# Patient Record
Sex: Female | Born: 1985 | Race: Black or African American | Hispanic: No | Marital: Single | Smoking: Current every day smoker
Health system: Southern US, Community
[De-identification: ages and names within clinical notes are randomized; demographics above are authoritative.]

## PROBLEM LIST (undated history)

## (undated) ENCOUNTER — Inpatient Hospital Stay (HOSPITAL_COMMUNITY): Payer: Self-pay

## (undated) DIAGNOSIS — B009 Herpesviral infection, unspecified: Secondary | ICD-10-CM

## (undated) DIAGNOSIS — A749 Chlamydial infection, unspecified: Secondary | ICD-10-CM

## (undated) DIAGNOSIS — D649 Anemia, unspecified: Secondary | ICD-10-CM

## (undated) DIAGNOSIS — N760 Acute vaginitis: Secondary | ICD-10-CM

## (undated) DIAGNOSIS — A599 Trichomoniasis, unspecified: Secondary | ICD-10-CM

## (undated) DIAGNOSIS — B9689 Other specified bacterial agents as the cause of diseases classified elsewhere: Secondary | ICD-10-CM

## (undated) HISTORY — PX: HERNIA REPAIR: SHX51

## (undated) HISTORY — PX: OTHER SURGICAL HISTORY: SHX169

---
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
## 2007-01-21 ENCOUNTER — Emergency Department (HOSPITAL_COMMUNITY): Admission: EM | Admit: 2007-01-21 | Discharge: 2007-01-21 | Payer: Self-pay | Admitting: Emergency Medicine

## 2007-01-22 ENCOUNTER — Inpatient Hospital Stay (HOSPITAL_COMMUNITY): Admission: AD | Admit: 2007-01-22 | Discharge: 2007-01-23 | Payer: Self-pay | Admitting: Obstetrics & Gynecology

## 2007-06-09 ENCOUNTER — Inpatient Hospital Stay (HOSPITAL_COMMUNITY): Admission: AD | Admit: 2007-06-09 | Discharge: 2007-06-09 | Payer: Self-pay | Admitting: Family Medicine

## 2007-06-10 ENCOUNTER — Inpatient Hospital Stay (HOSPITAL_COMMUNITY): Admission: AD | Admit: 2007-06-10 | Discharge: 2007-06-10 | Payer: Self-pay | Admitting: Family Medicine

## 2009-01-17 ENCOUNTER — Inpatient Hospital Stay (HOSPITAL_COMMUNITY): Admission: AD | Admit: 2009-01-17 | Discharge: 2009-01-17 | Payer: Self-pay | Admitting: Obstetrics & Gynecology

## 2009-01-17 ENCOUNTER — Ambulatory Visit: Payer: Self-pay | Admitting: Advanced Practice Midwife

## 2009-01-22 ENCOUNTER — Inpatient Hospital Stay (HOSPITAL_COMMUNITY): Admission: AD | Admit: 2009-01-22 | Discharge: 2009-01-23 | Payer: Self-pay | Admitting: Obstetrics & Gynecology

## 2009-02-02 ENCOUNTER — Inpatient Hospital Stay (HOSPITAL_COMMUNITY): Admission: RE | Admit: 2009-02-02 | Discharge: 2009-02-02 | Payer: Self-pay | Admitting: Obstetrics & Gynecology

## 2009-02-21 ENCOUNTER — Emergency Department (HOSPITAL_COMMUNITY): Admission: EM | Admit: 2009-02-21 | Discharge: 2009-02-21 | Payer: Self-pay | Admitting: Emergency Medicine

## 2009-03-31 ENCOUNTER — Inpatient Hospital Stay (HOSPITAL_COMMUNITY): Admission: AD | Admit: 2009-03-31 | Discharge: 2009-04-02 | Payer: Self-pay | Admitting: Obstetrics

## 2009-04-17 ENCOUNTER — Emergency Department (HOSPITAL_COMMUNITY): Admission: EM | Admit: 2009-04-17 | Discharge: 2009-04-17 | Payer: Self-pay | Admitting: Emergency Medicine

## 2009-04-28 ENCOUNTER — Ambulatory Visit (HOSPITAL_COMMUNITY): Admission: RE | Admit: 2009-04-28 | Discharge: 2009-04-28 | Payer: Self-pay | Admitting: Obstetrics

## 2009-07-10 ENCOUNTER — Inpatient Hospital Stay (HOSPITAL_COMMUNITY): Admission: AD | Admit: 2009-07-10 | Discharge: 2009-07-10 | Payer: Self-pay | Admitting: Obstetrics

## 2009-07-27 IMAGING — US US OB TRANSVAGINAL
1 series · 14 of 27 positions shown · non-contrast
Comparison: none

OBSTETRICAL ULTRASOUND:
 This ultrasound exam was performed in the [HOSPITAL] Ultrasound Department.  The OB US report was generated in the AS system, and faxed to the ordering physician.  This report is also available in [REDACTED] PACS.

[Series 1: us ob transvaginal · 0.12mm/px · 27 acquisitions, 14 frames shown]
[im 1/27]
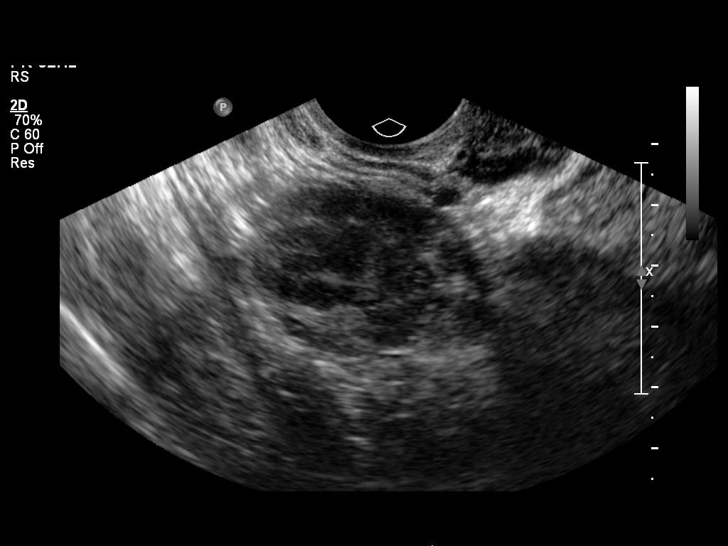
[im 3/27]
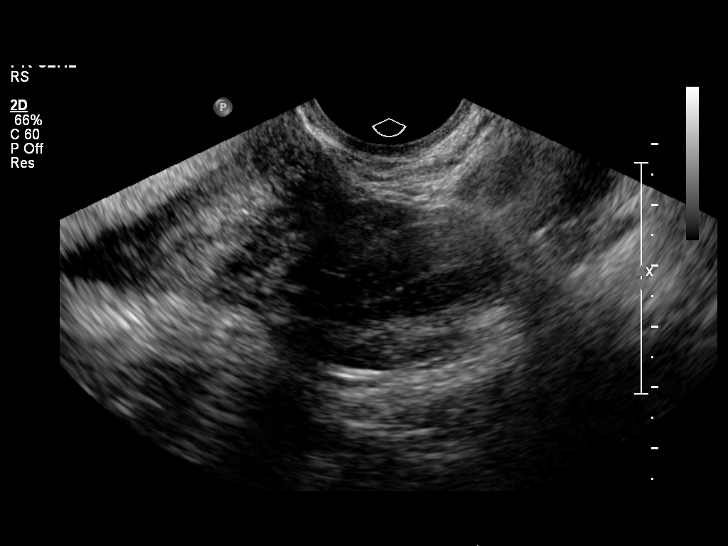
[im 5/27]
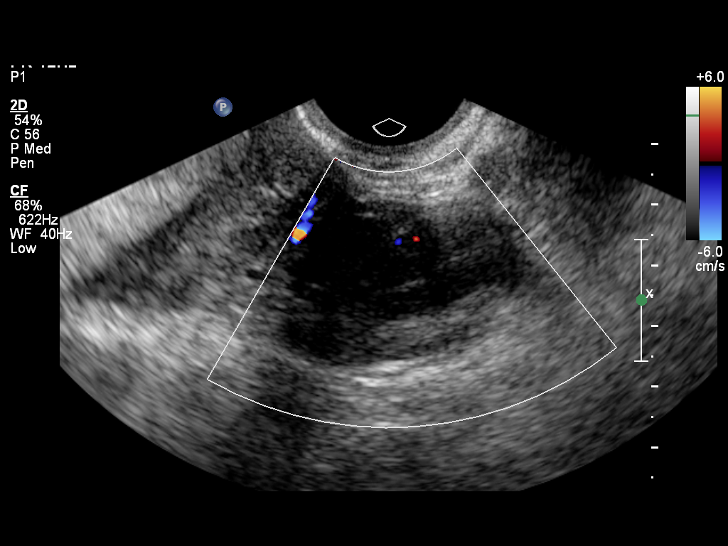
[im 7/27]
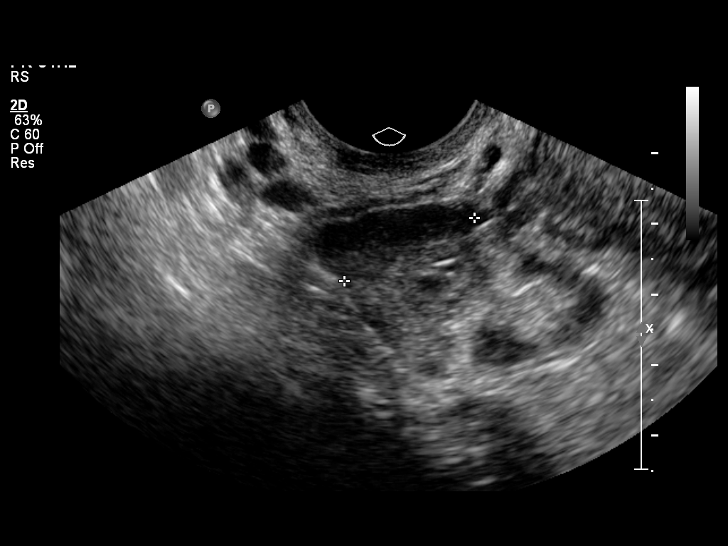
[im 9/27]
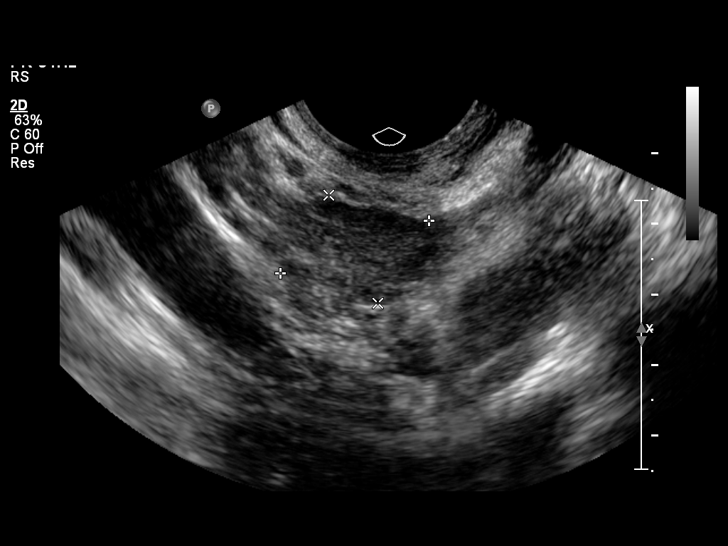
[im 11/27]
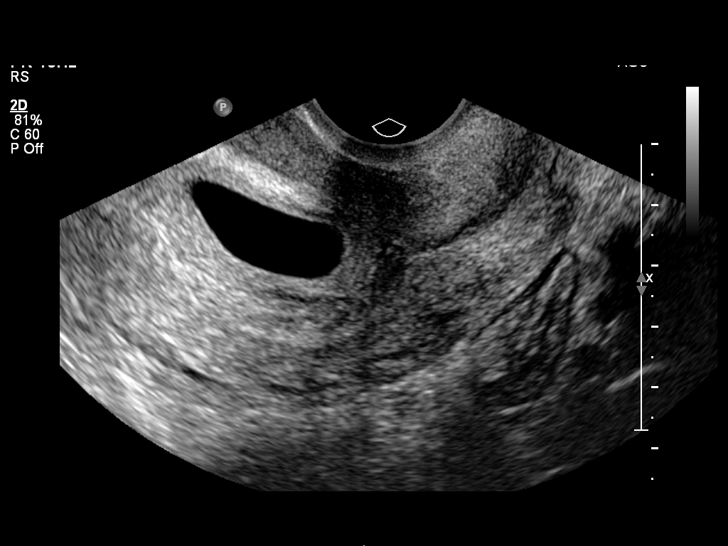
[im 13/27]
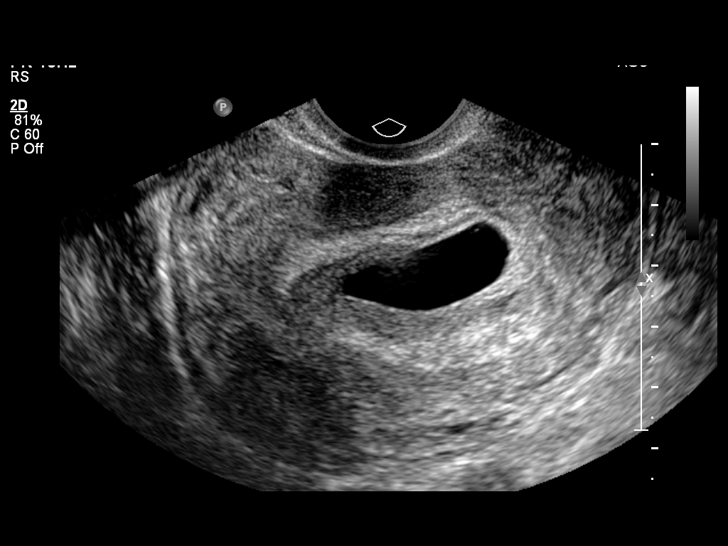
[im 15/27]
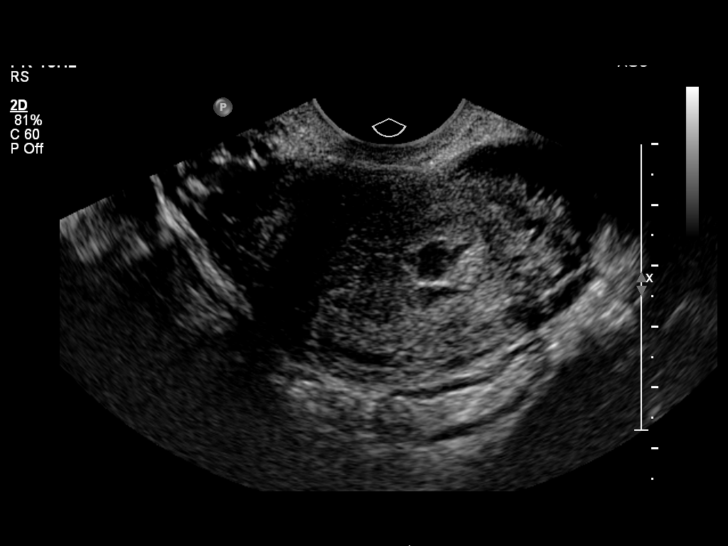
[im 17/27]
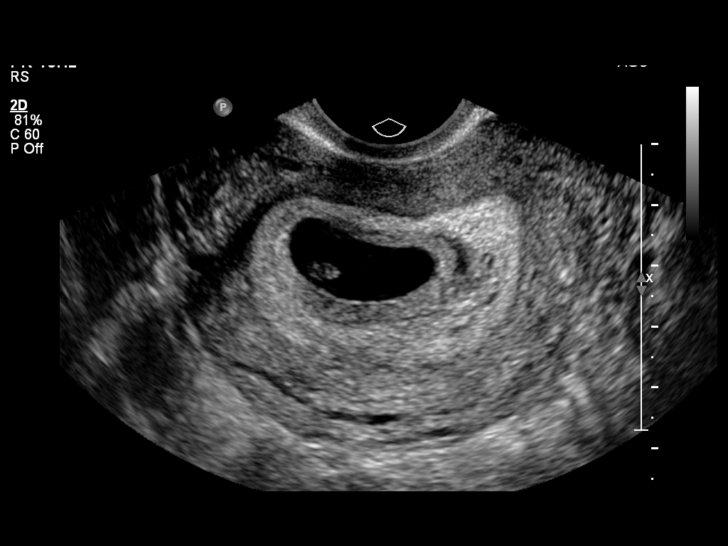
[im 19/27]
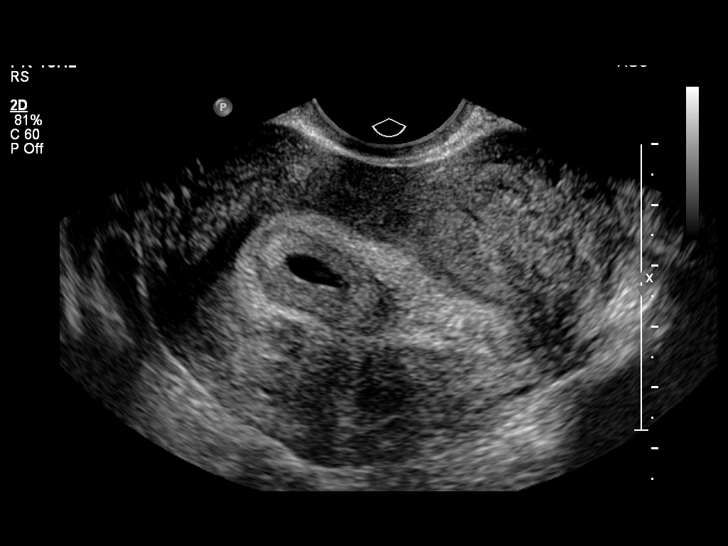
[im 21/27]
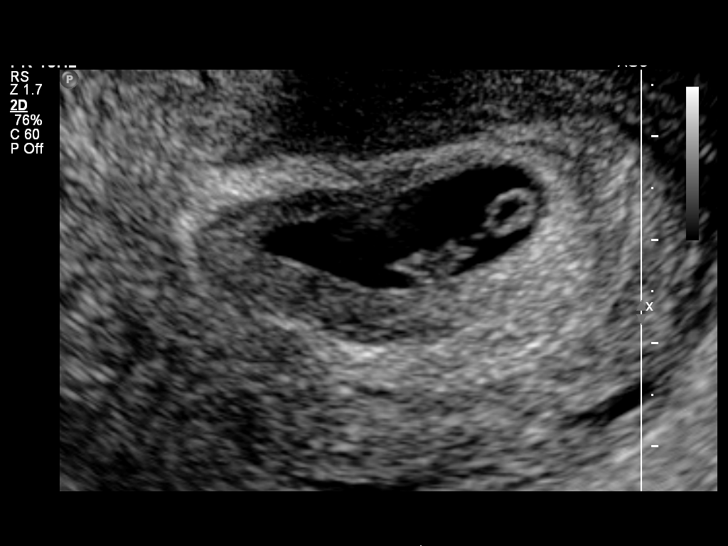
[im 23/27]
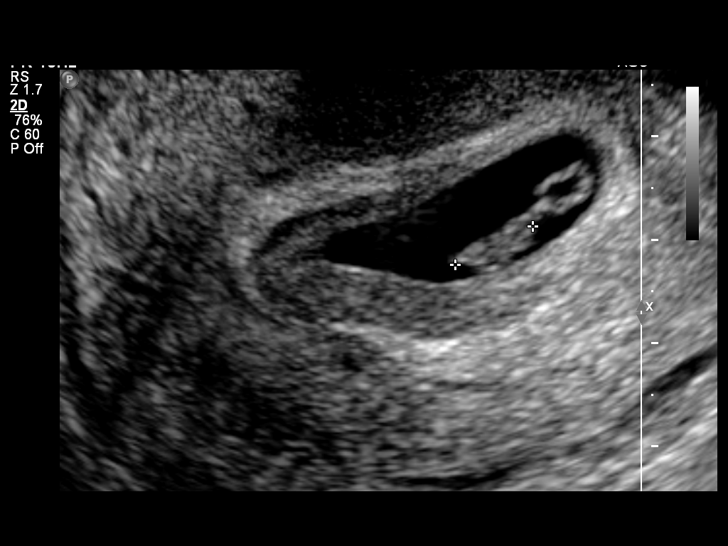
[im 25/27]
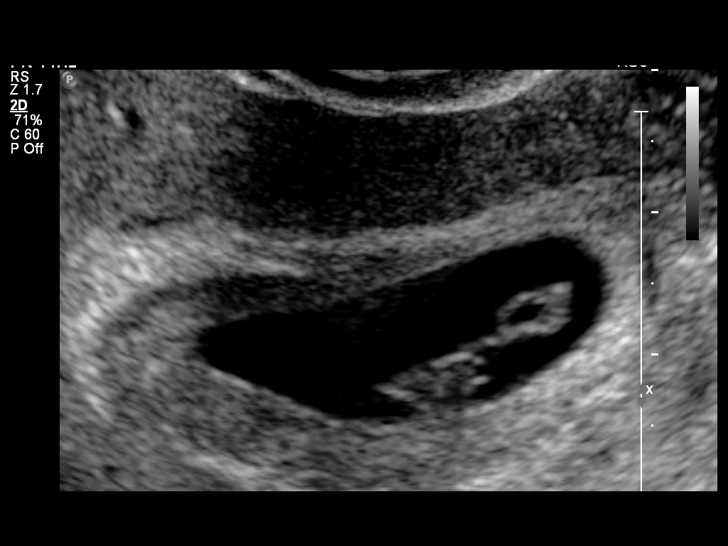
[im 27/27]
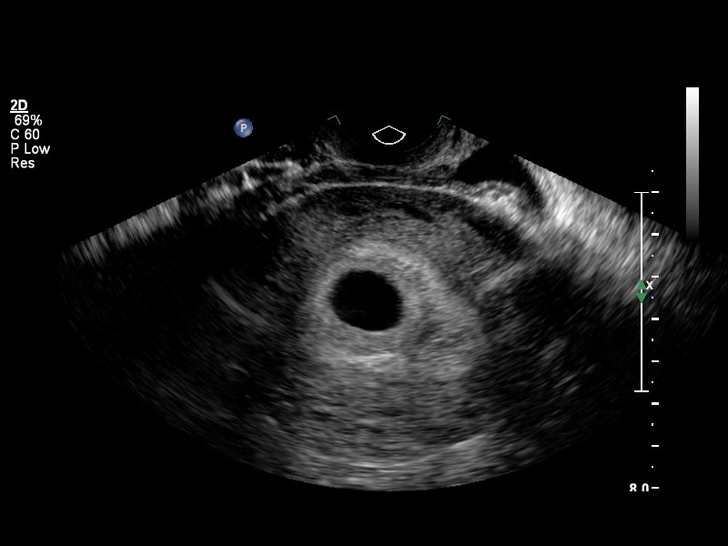

[14 of 27 positions shown; findings below may reference images not displayed]

IMPRESSION: See AS Obstetric US report.

## 2009-08-12 ENCOUNTER — Ambulatory Visit (HOSPITAL_COMMUNITY): Admission: RE | Admit: 2009-08-12 | Discharge: 2009-08-12 | Payer: Self-pay | Admitting: Obstetrics

## 2009-08-24 ENCOUNTER — Inpatient Hospital Stay (HOSPITAL_COMMUNITY): Admission: AD | Admit: 2009-08-24 | Discharge: 2009-08-24 | Payer: Self-pay | Admitting: Obstetrics

## 2009-09-21 ENCOUNTER — Inpatient Hospital Stay (HOSPITAL_COMMUNITY): Admission: AD | Admit: 2009-09-21 | Discharge: 2009-09-25 | Payer: Self-pay | Admitting: Obstetrics

## 2009-09-22 ENCOUNTER — Encounter (INDEPENDENT_AMBULATORY_CARE_PROVIDER_SITE_OTHER): Payer: Self-pay | Admitting: Obstetrics

## 2010-11-27 LAB — CBC
HCT: 28.2 % — ABNORMAL LOW (ref 36.0–46.0)
HCT: 36.2 % (ref 36.0–46.0)
Hemoglobin: 9.6 g/dL — ABNORMAL LOW (ref 12.0–15.0)
MCHC: 33.6 g/dL (ref 30.0–36.0)
MCHC: 33.9 g/dL (ref 30.0–36.0)
MCV: 97.6 fL (ref 78.0–100.0)
Platelets: 156 10*3/uL (ref 150–400)
Platelets: 201 10*3/uL (ref 150–400)
RDW: 13.2 % (ref 11.5–15.5)
WBC: 12.1 10*3/uL — ABNORMAL HIGH (ref 4.0–10.5)

## 2010-12-15 LAB — URINALYSIS, ROUTINE W REFLEX MICROSCOPIC
Bilirubin Urine: NEGATIVE
Hgb urine dipstick: NEGATIVE
Nitrite: NEGATIVE
Protein, ur: NEGATIVE mg/dL
Urobilinogen, UA: 0.2 mg/dL (ref 0.0–1.0)

## 2010-12-15 LAB — WET PREP, GENITAL: Trich, Wet Prep: NONE SEEN

## 2010-12-15 LAB — GC/CHLAMYDIA PROBE AMP, GENITAL

## 2010-12-17 LAB — POCT I-STAT, CHEM 8
Calcium, Ion: 1.15 mmol/L (ref 1.12–1.32)
Glucose, Bld: 78 mg/dL (ref 70–99)
HCT: 36 % (ref 36.0–46.0)
Hemoglobin: 12.2 g/dL (ref 12.0–15.0)
TCO2: 21 mmol/L (ref 0–100)

## 2010-12-18 LAB — URINALYSIS, ROUTINE W REFLEX MICROSCOPIC
Bilirubin Urine: NEGATIVE
Ketones, ur: 15 mg/dL — AB
Nitrite: NEGATIVE
Protein, ur: NEGATIVE mg/dL
Urobilinogen, UA: 1 mg/dL (ref 0.0–1.0)
pH: 5.5 (ref 5.0–8.0)

## 2010-12-18 LAB — COMPREHENSIVE METABOLIC PANEL
Albumin: 3.7 g/dL (ref 3.5–5.2)
BUN: 3 mg/dL — ABNORMAL LOW (ref 6–23)
Chloride: 97 mEq/L (ref 96–112)
Creatinine, Ser: 0.45 mg/dL (ref 0.4–1.2)
Total Bilirubin: 0.4 mg/dL (ref 0.3–1.2)

## 2010-12-18 LAB — GLUCOSE, CAPILLARY: Glucose-Capillary: 124 mg/dL — ABNORMAL HIGH (ref 70–99)

## 2010-12-20 LAB — HCG, QUANTITATIVE, PREGNANCY

## 2010-12-20 LAB — WET PREP, GENITAL
Clue Cells Wet Prep HPF POC: NONE SEEN
Trich, Wet Prep: NONE SEEN
Trich, Wet Prep: NONE SEEN

## 2010-12-20 LAB — URINALYSIS, ROUTINE W REFLEX MICROSCOPIC
Bilirubin Urine: NEGATIVE
Bilirubin Urine: NEGATIVE
Ketones, ur: NEGATIVE mg/dL
Ketones, ur: NEGATIVE mg/dL
Nitrite: NEGATIVE
Nitrite: NEGATIVE
Specific Gravity, Urine: 1.025 (ref 1.005–1.030)
Urobilinogen, UA: 0.2 mg/dL (ref 0.0–1.0)
Urobilinogen, UA: 2 mg/dL — ABNORMAL HIGH (ref 0.0–1.0)

## 2010-12-20 LAB — CBC
MCHC: 34.3 g/dL (ref 30.0–36.0)
MCV: 91.7 fL (ref 78.0–100.0)
Platelets: 331 10*3/uL (ref 150–400)
RDW: 14.3 % (ref 11.5–15.5)

## 2010-12-20 LAB — GC/CHLAMYDIA PROBE AMP, GENITAL

## 2011-01-24 NOTE — Discharge Summary (Signed)
NAMEZULEY, LUTTER NO.:  [id number]   MEDICAL RECORD NO.:  [id number]          PATIENT TYPE:  INP   LOCATION:  9303                          FACILITY:  [facility]   PHYSICIAN:  Kathreen Cosier, M.D.DATE OF BIRTH:  [birth year]   DATE OF ADMISSION:  03/31/2009  DATE OF DISCHARGE:                               DISCHARGE SUMMARY   The patient is a 25 year old gravida 1, EDC October 01, 2009.  A 2-week  history of nausea, vomiting, and on admission her potassium was 2.9.  She had low ketone in the urine, and she received Ringer's lactate IV  plus 40 mEq of KCl to each IV.  She was started on the steroid taper and  during the course of April 02, 2009, she had no more nausea, vomiting,  and was discharged on July 23, ambulatory to see me in 1-week for her  next prenatal visit.   DISCHARGE DIAGNOSIS:  Status post hyperemesis of pregnancy.           ______________________________  Kathreen Cosier, M.D.     BAM/MEDQ  D:  04/02/2009  T:  04/02/2009  Job:  [id number]

## 2011-03-04 ENCOUNTER — Inpatient Hospital Stay (HOSPITAL_COMMUNITY): Payer: Self-pay

## 2011-03-04 ENCOUNTER — Inpatient Hospital Stay (HOSPITAL_COMMUNITY)
Admission: AD | Admit: 2011-03-04 | Discharge: 2011-03-04 | Disposition: A | Discharge status: Home / Self Care | Payer: Self-pay | Source: Ambulatory Visit | Attending: Obstetrics & Gynecology | Admitting: Obstetrics & Gynecology

## 2011-03-04 DIAGNOSIS — A5901 Trichomonal vulvovaginitis: Secondary | ICD-10-CM | POA: Insufficient documentation

## 2011-03-04 DIAGNOSIS — N644 Mastodynia: Secondary | ICD-10-CM

## 2011-03-04 LAB — WET PREP, GENITAL
Clue Cells Wet Prep HPF POC: NONE SEEN
Yeast Wet Prep HPF POC: NEGATIVE — AB

## 2011-03-04 LAB — URINALYSIS, ROUTINE W REFLEX MICROSCOPIC
Nitrite: NEGATIVE
Protein, ur: NEGATIVE mg/dL
Specific Gravity, Urine: >1.03 — ABNORMAL HIGH (ref 1.005–1.030)
Urobilinogen, UA: 0.2 mg/dL (ref 0.0–1.0)

## 2011-03-04 LAB — URINE MICROSCOPIC-ADD ON

## 2011-03-07 LAB — GC/CHLAMYDIA PROBE AMP, GENITAL
Chlamydia, DNA Probe: NEGATIVE
GC Probe Amp, Genital: NEGATIVE

## 2011-06-22 LAB — POCT PREGNANCY, URINE
Operator id: 13440
Preg Test, Ur: NEGATIVE

## 2011-08-21 ENCOUNTER — Encounter (HOSPITAL_COMMUNITY): Payer: Self-pay | Admitting: *Deleted

## 2011-08-21 ENCOUNTER — Inpatient Hospital Stay (HOSPITAL_COMMUNITY)
Admission: AD | Admit: 2011-08-21 | Discharge: 2011-08-21 | Disposition: A | Discharge status: Home / Self Care | Payer: Medicaid Other | Source: Ambulatory Visit | Attending: Obstetrics & Gynecology | Admitting: Obstetrics & Gynecology

## 2011-08-21 DIAGNOSIS — N72 Inflammatory disease of cervix uteri: Secondary | ICD-10-CM | POA: Insufficient documentation

## 2011-08-21 DIAGNOSIS — R109 Unspecified abdominal pain: Secondary | ICD-10-CM | POA: Insufficient documentation

## 2011-08-21 DIAGNOSIS — A5901 Trichomonal vulvovaginitis: Secondary | ICD-10-CM | POA: Insufficient documentation

## 2011-08-21 DIAGNOSIS — A599 Trichomoniasis, unspecified: Secondary | ICD-10-CM

## 2011-08-21 HISTORY — DX: Chlamydial infection, unspecified: A74.9

## 2011-08-21 HISTORY — DX: Trichomoniasis, unspecified: A59.9

## 2011-08-21 HISTORY — DX: Herpesviral infection, unspecified: B00.9

## 2011-08-21 LAB — WET PREP, GENITAL: Yeast Wet Prep HPF POC: NONE SEEN

## 2011-08-21 LAB — URINE MICROSCOPIC-ADD ON

## 2011-08-21 LAB — URINALYSIS, ROUTINE W REFLEX MICROSCOPIC
Bilirubin Urine: NEGATIVE
Ketones, ur: NEGATIVE mg/dL
Nitrite: NEGATIVE
pH: 6 (ref 5.0–8.0)

## 2011-08-21 MED ORDER — AZITHROMYCIN 250 MG PO TABS
1000.0000 mg | ORAL_TABLET | Freq: Once | ORAL | Status: AC
  Administered 2011-08-21: 1000 mg via ORAL
  Filled 2011-08-21: qty 4

## 2011-08-21 MED ORDER — METRONIDAZOLE 500 MG PO TABS: 500.0000 mg | ORAL_TABLET | Freq: Two times a day (BID) | ORAL | Status: AC

## 2011-08-21 NOTE — Progress Notes (Signed)
Patient states she has cramping for about one week. Has had some brown spotting with an odor in between periods.

## 2011-08-21 NOTE — ED Provider Notes (Signed)
History     CSN: [id number] Arrival date & time: 08/21/2011  4:20 PM   None     Chief Complaint  Patient presents with  . Abdominal Pain    HPI Katelyn Shaw is a 25 y.o. female who presents to MAU for vaginal discharge that started about a week ago. Current sex partner for a few years. History of trichomonas, chlamydia and HSV. Describes the discharge as watery with vaginal itching. Also c/o lower abdominal cramping the past few days.  Past Medical History  Diagnosis Date  . Trichomonas   . Chlamydia   . Herpes     Type 1, lesion X 1    Past Surgical History  Procedure Date  . Umbilical hernia   . Cyst removed from r ear     Family History  Problem Relation Age of Onset  . Anesthesia problems Neg Hx     History  Substance Use Topics  . Smoking status: Current Everyday Smoker -- 1.0 packs/day  . Smokeless tobacco: Never Used  . Alcohol Use: No     Occas. once per month    OB History    Grav Para Term Preterm Abortions TAB SAB Ect Mult Living   1 1 1  0 0 0 0 0 0 1      Review of Systems  Constitutional: Negative for fever, chills, diaphoresis and fatigue.  HENT: Positive for congestion. Negative for ear pain, sore throat, facial swelling, neck pain, neck stiffness, dental problem and sinus pressure.   Eyes: Negative for photophobia, pain and discharge.  Respiratory: Positive for cough. Negative for chest tightness and wheezing.        Just getting over URI  Gastrointestinal: Negative for nausea, vomiting, abdominal pain, diarrhea, constipation and abdominal distention.  Genitourinary: Positive for vaginal bleeding, vaginal discharge, vaginal pain and pelvic pain. Negative for dysuria, frequency, flank pain and difficulty urinating.  Musculoskeletal: Negative for myalgias, back pain and gait problem.  Skin: Negative for color change and rash.  Neurological: Positive for headaches. Negative for dizziness, speech difficulty, weakness, light-headedness and  numbness.  Psychiatric/Behavioral: Negative for confusion and agitation.    Allergies  Codeine  Home Medications  No current outpatient prescriptions on file.  BP 129/77  Pulse 69  Temp(Src) 98.4 F (36.9 C) (Oral)  Resp 16  Ht 5' 6.5" (1.689 m)  Wt 164 lb 12.8 oz (74.753 kg)  BMI 26.20 kg/m2  SpO2 98%  LMP 08/13/2011  Physical Exam  Nursing note and vitals reviewed. Constitutional: She is oriented to person, place, and time. She appears well-developed and well-nourished. No distress.  HENT:  Head: Normocephalic.  Eyes: EOM are normal.  Neck: Neck supple.  Cardiovascular: Normal rate.   Pulmonary/Chest: Effort normal.  Abdominal: Soft. There is no tenderness.  Genitourinary:       External genitalia without lesions. Frothy yellow discharge vaginal vault. Cervix inflamed with strawberry appearance. Mild CMT, no adnexal mass or tenderness palpated. Uterus without palpable enlargement.  Musculoskeletal: Normal range of motion.  Neurological: She is alert and oriented to person, place, and time. No cranial nerve deficit.  Skin: Skin is warm and dry.  Psychiatric: She has a normal mood and affect. Her behavior is normal. Judgment and thought content normal.   Results for orders placed during the hospital encounter of 08/21/11 (from the past 24 hour(s))  URINALYSIS, ROUTINE W REFLEX MICROSCOPIC     Status: Abnormal   Collection Time   08/21/11  4:35 PM  Component Value Range   Color, Urine YELLOW  YELLOW    APPearance HAZY (*) CLEAR    Specific Gravity, Urine >1.030 (*) 1.005 - 1.030    pH 6.0  5.0 - 8.0    Glucose, UA NEGATIVE  NEGATIVE (mg/dL)   Hgb urine dipstick SMALL (*) NEGATIVE    Bilirubin Urine NEGATIVE  NEGATIVE    Ketones, ur NEGATIVE  NEGATIVE (mg/dL)   Protein, ur NEGATIVE  NEGATIVE (mg/dL)   Urobilinogen, UA 0.2  0.0 - 1.0 (mg/dL)   Nitrite NEGATIVE  NEGATIVE    Leukocytes, UA NEGATIVE  NEGATIVE   URINE MICROSCOPIC-ADD ON     Status: Abnormal    Collection Time   08/21/11  4:35 PM      Component Value Range   Squamous Epithelial / LPF FEW (*) RARE    WBC, UA 3-6  <3 (WBC/hpf)   RBC / HPF 0-2  <3 (RBC/hpf)   Bacteria, UA FEW (*) RARE    Urine-Other MUCOUS PRESENT    POCT PREGNANCY, URINE     Status: Normal   Collection Time   08/21/11  5:11 PM      Component Value Range   Preg Test, Ur NEGATIVE    WET PREP, GENITAL     Status: Abnormal   Collection Time   08/21/11  5:35 PM      Component Value Range   Yeast, Wet Prep NONE SEEN  NONE SEEN    Trich, Wet Prep MODERATE (*) NONE SEEN    Clue Cells, Wet Prep FEW (*) NONE SEEN    WBC, Wet Prep HPF POC FEW (*) NONE SEEN    Assessment: Trichomonas vaginosis   Cervicitis  Plan:  Flagyl Rx   Zithromax 1 gram po ED Course  Procedures        [place], NP 08/21/11 1806

## 2011-08-22 LAB — GC/CHLAMYDIA PROBE AMP, GENITAL
Chlamydia, DNA Probe: NEGATIVE
GC Probe Amp, Genital: NEGATIVE

## 2011-08-23 NOTE — ED Provider Notes (Signed)
Agree with above note.  LEGGETT,KELLY H. 08/23/2011 12:44 PM  

## 2011-08-26 IMAGING — US US PELVIS COMPLETE
1 series · 14 of 25 positions shown · non-contrast
Comparison: Prior OB ultrasounds 2858

CLINICAL DATA: Pelvic pain, enlarged uterus on physical exam

TRANSABDOMINAL AND TRANSVAGINAL ULTRASOUND OF PELVIS
TECHNIQUE: Both transabdominal and transvaginal ultrasound
examinations of the pelvis were performed. Transabdominal technique
was performed for global imaging of the pelvis including uterus,
ovaries, adnexal regions, and pelvic cul-de-sac.

[Series 1: us pelvis complete · 14 of 38 slices shown]
[im 1/38]
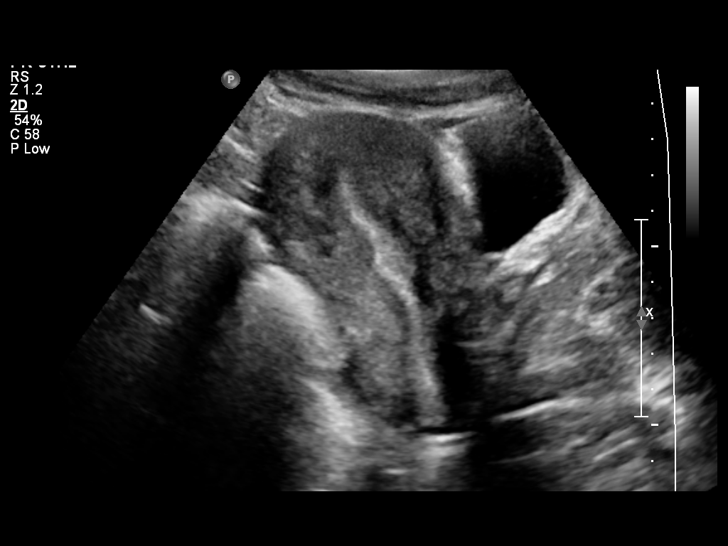
[im 4/38]
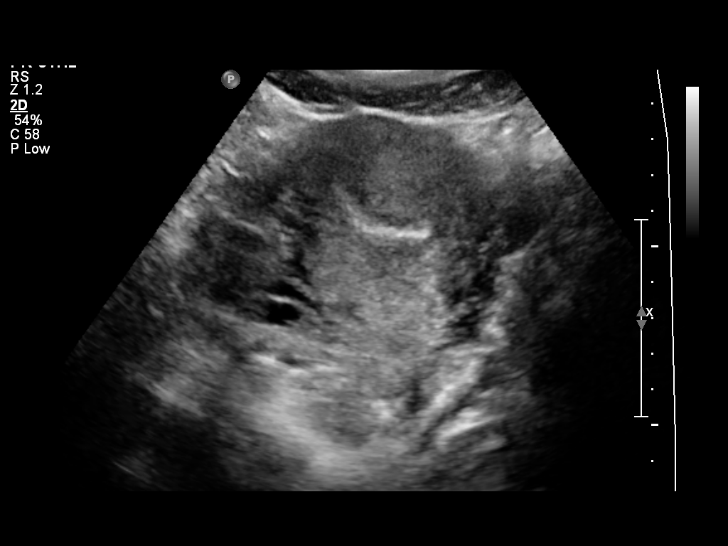
[im 7/38]
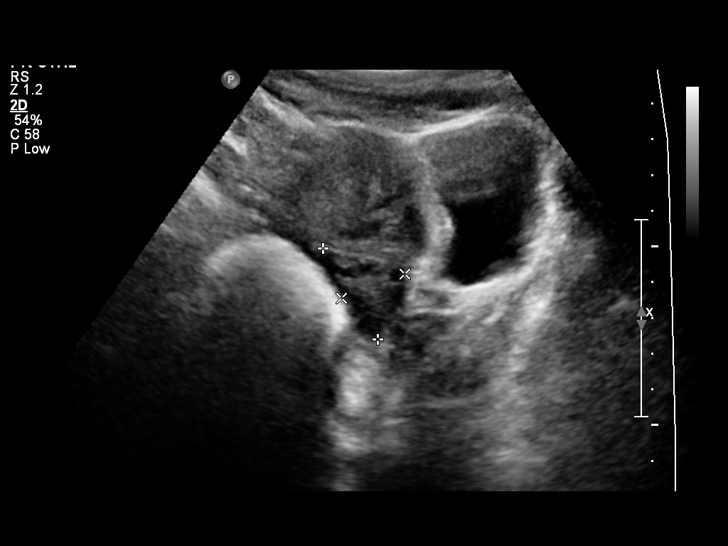
[im 10/38]
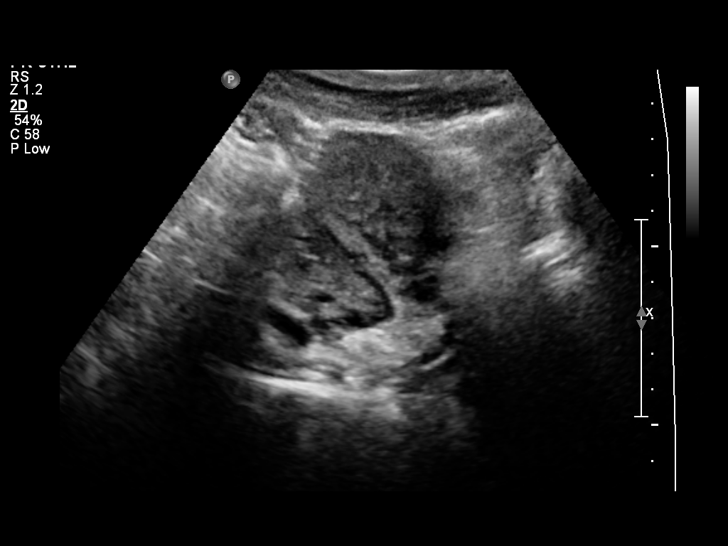
[im 13/38]
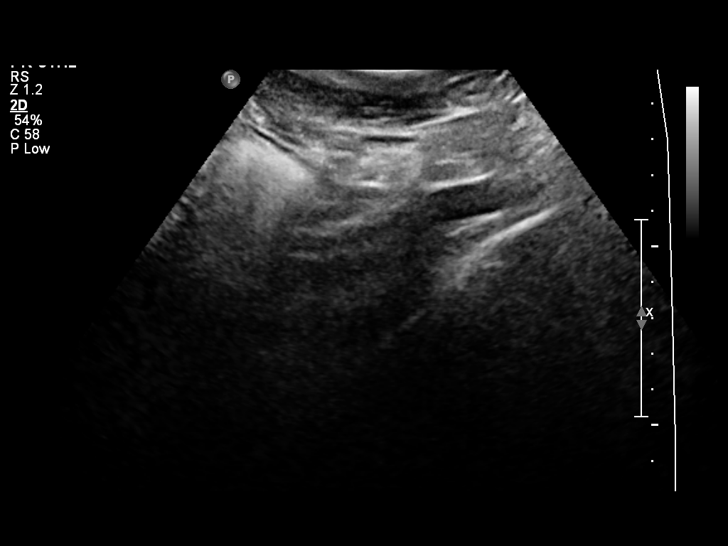
[im 14/38]
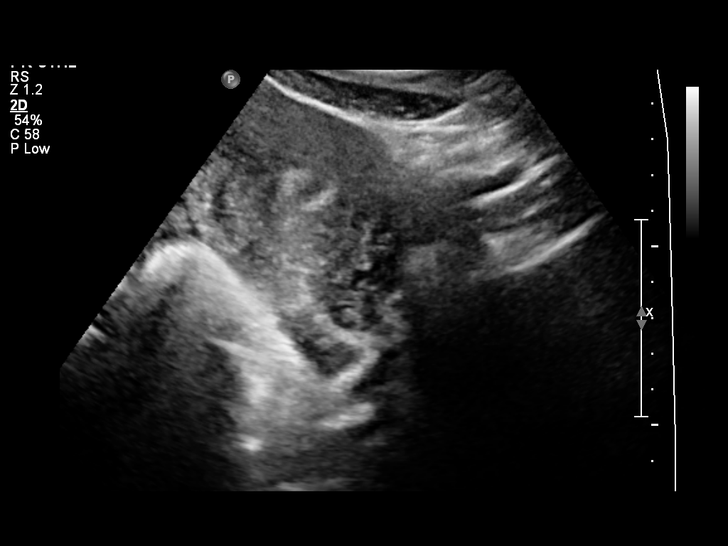
[im 17/38]
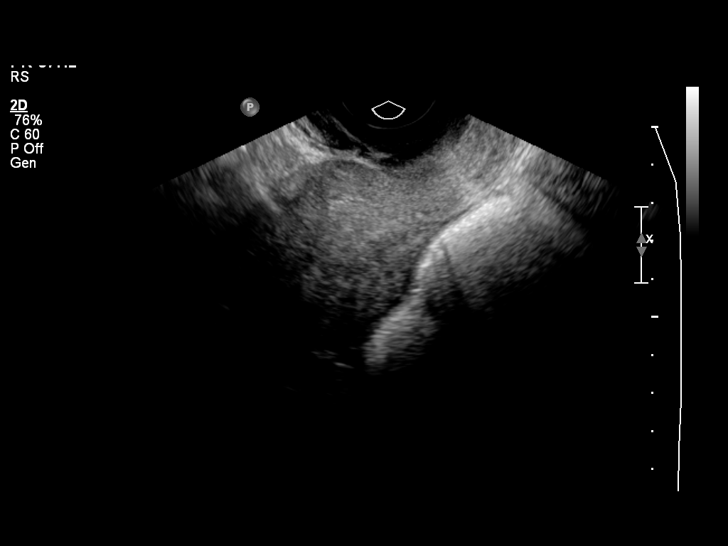
[im 21/38]
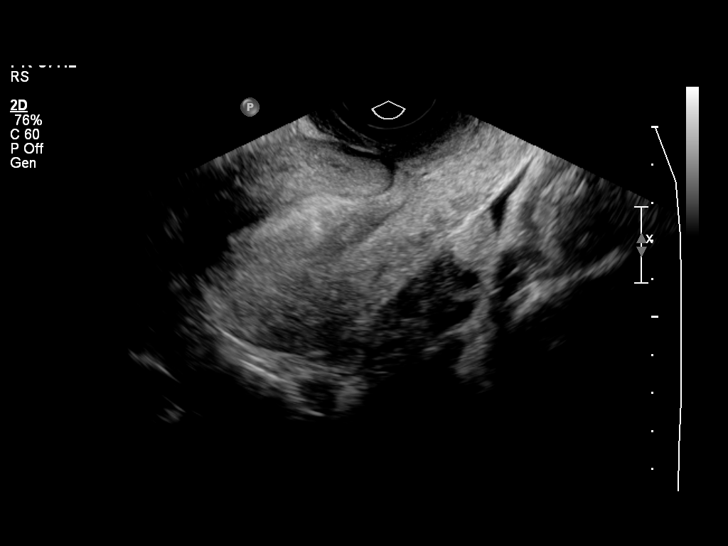
[im 24/38]
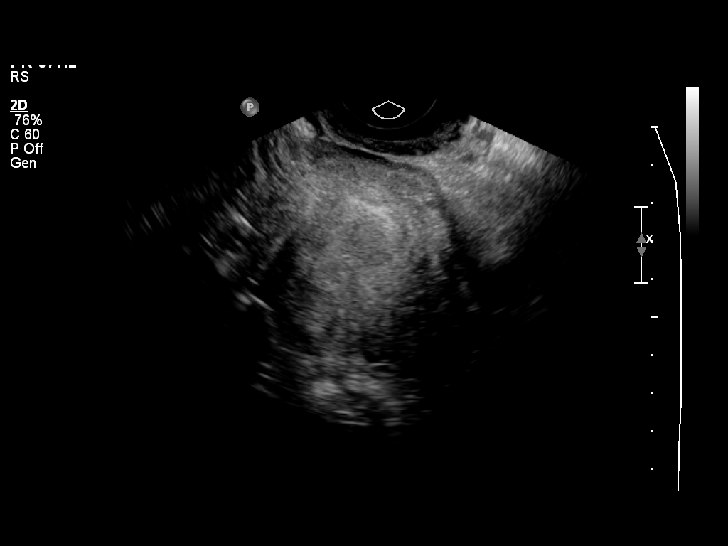
[im 25/38]
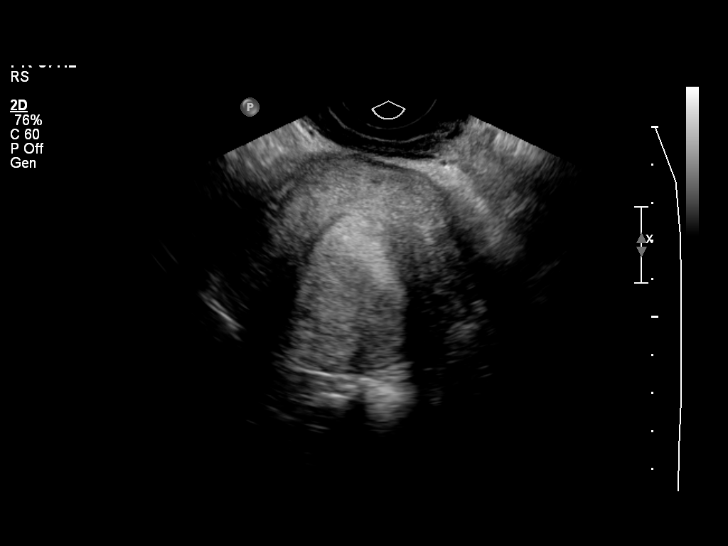
[im 28/38]
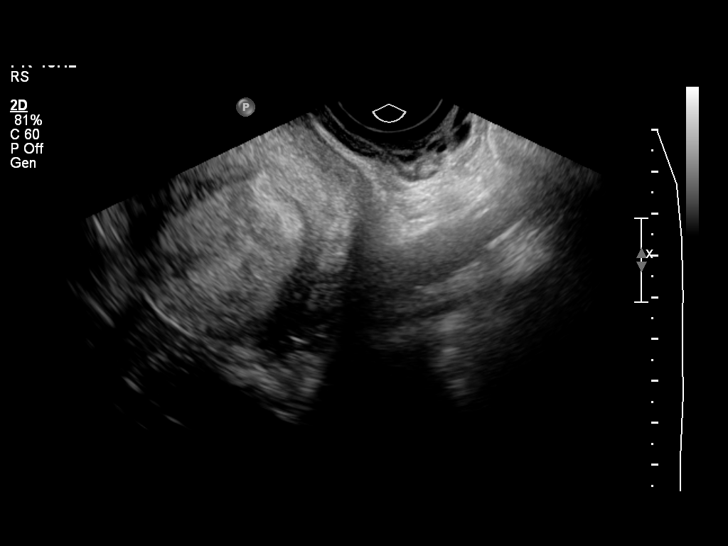
[im 31/38]
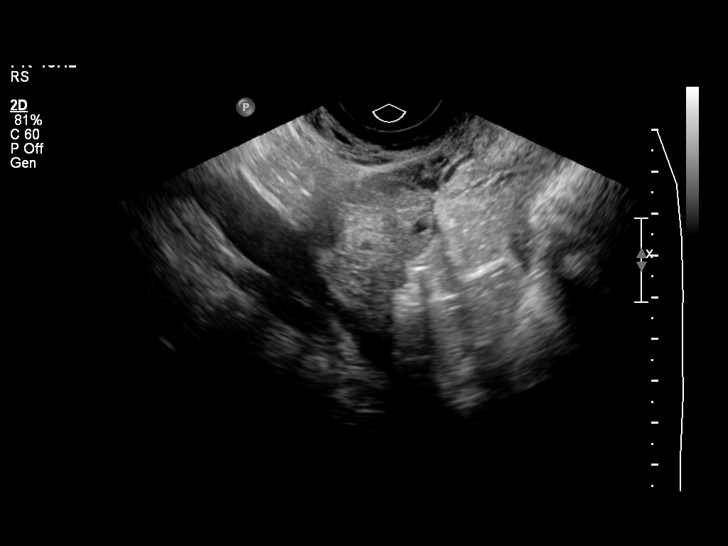
[im 34/38]
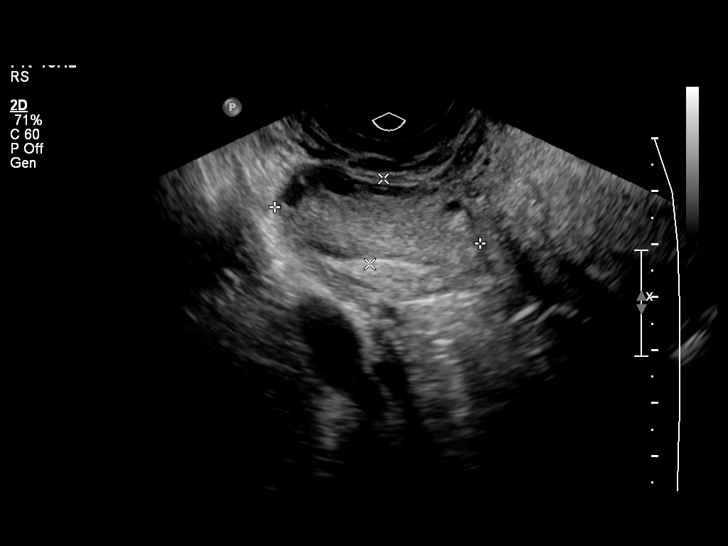
[im 38/38]
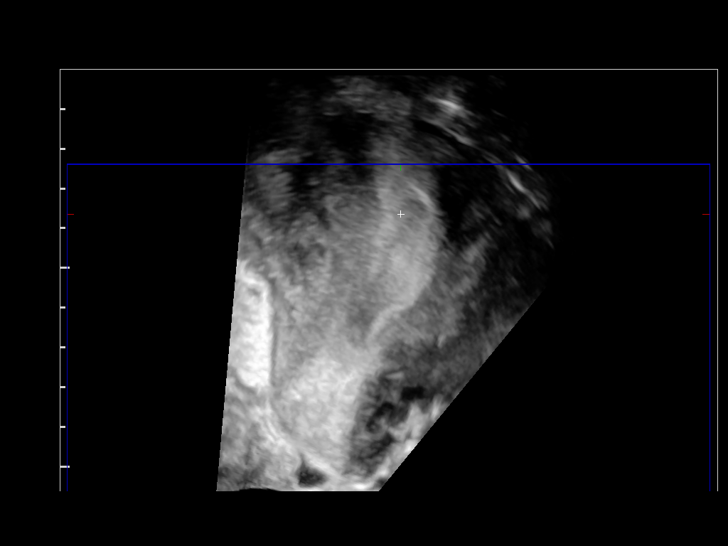

[14 of 25 positions shown; findings below may reference images not displayed]

It was necessary to proceed with endovaginal exam following the
transabdomnial exam to visualize the endometrium.
FINDINGS: Uterus: 10.2 x 7.0 x 5.4 cm.  Anteverted, anteflexed.  No focal
abnormality.

Endometrium: 1.2 cm.  Uniformly echogenic.

Right ovary:  3.9 x 2.9 x 1.6 cm.  Normal.

Left ovary: 3.6 x 2.6 x 1.8 cm.  Normal.

Other findings: No free fluid
IMPRESSION: Normal exam.

## 2012-08-07 ENCOUNTER — Encounter (HOSPITAL_COMMUNITY): Payer: Self-pay | Admitting: *Deleted

## 2012-08-07 ENCOUNTER — Emergency Department (HOSPITAL_COMMUNITY)
Admission: EM | Admit: 2012-08-07 | Discharge: 2012-08-07 | Disposition: A | Discharge status: Home / Self Care | Payer: Medicaid Other | Attending: Emergency Medicine | Admitting: Emergency Medicine

## 2012-08-07 DIAGNOSIS — F17200 Nicotine dependence, unspecified, uncomplicated: Secondary | ICD-10-CM | POA: Insufficient documentation

## 2012-08-07 DIAGNOSIS — Z8619 Personal history of other infectious and parasitic diseases: Secondary | ICD-10-CM | POA: Insufficient documentation

## 2012-08-07 DIAGNOSIS — J020 Streptococcal pharyngitis: Secondary | ICD-10-CM | POA: Insufficient documentation

## 2012-08-07 LAB — RAPID STREP SCREEN (MED CTR MEBANE ONLY): Streptococcus, Group A Screen (Direct): POSITIVE — AB

## 2012-08-07 MED ORDER — HYDROCODONE-ACETAMINOPHEN 7.5-500 MG/15ML PO SOLN: 10.0000 mL | ORAL | Status: DC | PRN

## 2012-08-07 MED ORDER — PENICILLIN G BENZATHINE 1200000 UNIT/2ML IM SUSP
1.2000 10*6.[IU] | Freq: Once | INTRAMUSCULAR | Status: AC
  Administered 2012-08-07: 1.2 10*6.[IU] via INTRAMUSCULAR
  Filled 2012-08-07: qty 2

## 2012-08-07 MED ORDER — PREDNISONE 20 MG PO TABS
60.0000 mg | ORAL_TABLET | Freq: Once | ORAL | Status: AC
  Administered 2012-08-07: 60 mg via ORAL
  Filled 2012-08-07: qty 3

## 2012-08-07 MED ORDER — HYDROCODONE-ACETAMINOPHEN 7.5-500 MG/15ML PO SOLN
10.0000 mL | Freq: Once | ORAL | Status: AC
  Administered 2012-08-07: 10 mL via ORAL
  Filled 2012-08-07: qty 15

## 2012-08-07 NOTE — ED Notes (Signed)
Patient c/o sorethroat onset several days ago states pain is worse in the am. Throat is red and tonsils swollen. C/o occ. Nasal congestion.

## 2012-08-07 NOTE — ED Provider Notes (Signed)
History     CSN: [id number]  Arrival date & time 08/07/12  7829   First MD Initiated Contact with Patient 08/07/12 [phone]      No chief complaint on file.   (Consider location/radiation/quality/duration/timing/severity/associated sxs/prior treatment) HPI Katelyn Shaw is a 26 y.o. female who presents with complaint of a sore throat. Pt states started few days ago. Throat/tonsils swollen, painful to swallow. States took robitussin with no relief. Stats family member has same problem, was seen yesterday, started on antibiotic, states he is feeling much better today. Pt admits feeling feverish, did not measure temp. Denies change in voice. Denies other URI symptoms.    Past Medical History  Diagnosis Date  . Trichomonas   . Chlamydia   . Herpes     Type 1, lesion X 1    Past Surgical History  Procedure Date  . Umbilical hernia   . Cyst removed from r ear   . Hernia repair     Family History  Problem Relation Age of Onset  . Anesthesia problems Neg Hx     History  Substance Use Topics  . Smoking status: Current Every Day Smoker -- 1.0 packs/day  . Smokeless tobacco: Never Used  . Alcohol Use: Yes     Comment: Occas. once per month    OB History    Grav Para Term Preterm Abortions TAB SAB Ect Mult Living   1 1 1  0 0 0 0 0 0 1      Review of Systems  Constitutional: Negative for fever and chills.  HENT: Positive for sore throat. Negative for congestion, neck pain, neck stiffness and sinus pressure.   Respiratory: Negative.   Cardiovascular: Negative.   Skin: Negative for rash.  Neurological: Negative for dizziness and weakness.    Allergies  Codeine  Home Medications  No current outpatient prescriptions on file.  BP 114/67  Pulse 102  Temp 99.1 F (37.3 C) (Oral)  Resp 18  SpO2 98%  Physical Exam  Nursing note and vitals reviewed. Constitutional: She is oriented to person, place, and time. She appears well-developed and well-nourished. No distress.   HENT:  Head: Normocephalic.  Right Ear: Tympanic membrane, external ear and ear canal normal.  Left Ear: External ear and ear canal normal.  Nose: Nose normal.  Mouth/Throat: Mucous membranes are normal. No uvula swelling. Oropharyngeal exudate present. No tonsillar abscesses.       Tonsils enlarged, white exudate. Uvula midline  Eyes: Conjunctivae normal are normal.  Neck: Neck supple.       Anterior cervical and posterior lymphadenopathy, tender  Cardiovascular: Normal rate, regular rhythm and normal heart sounds.   Pulmonary/Chest: Effort normal and breath sounds normal. No respiratory distress. She has no wheezes. She has no rales.  Lymphadenopathy:    She has cervical adenopathy.  Neurological: She is alert and oriented to person, place, and time.  Skin: Skin is warm and dry.  Psychiatric: She has a normal mood and affect.    ED Course  Procedures (including critical care time)  Results for orders placed during the hospital encounter of 08/07/12  RAPID STREP SCREEN      Component Value Range   Streptococcus, Group A Screen (Direct) POSITIVE (*) NEGATIVE   No results found.    1. Strep pharyngitis       MDM  Pt with sore throat, exam and screen consistent with strep pharyngitis. Pt non toxic. No evidence of peritonsillar or retropharyngeal abscess. Pt is able to swallow  and tolerating PO fluids. Pt choise to have a bicillin shot, 1.28million units given IM. Pain medications prescribed for discomfort. D/c home with precautions and follow up.        Lottie Mussel, [place] 08/07/12 [phone]

## 2012-08-08 NOTE — ED Provider Notes (Signed)
Medical screening examination/treatment/procedure(s) were performed by non-physician practitioner and as supervising physician I was immediately available for consultation/collaboration.   Hurman Horn, MD 08/08/12 [phone]

## 2012-11-18 ENCOUNTER — Inpatient Hospital Stay (HOSPITAL_COMMUNITY): Payer: Medicaid Other

## 2012-11-18 ENCOUNTER — Inpatient Hospital Stay (HOSPITAL_COMMUNITY)
Admission: AD | Admit: 2012-11-18 | Discharge: 2012-11-18 | Disposition: A | Discharge status: Home / Self Care | Payer: Medicaid Other | Source: Ambulatory Visit | Attending: Obstetrics & Gynecology | Admitting: Obstetrics & Gynecology

## 2012-11-18 ENCOUNTER — Encounter (HOSPITAL_COMMUNITY): Payer: Self-pay

## 2012-11-18 DIAGNOSIS — N938 Other specified abnormal uterine and vaginal bleeding: Secondary | ICD-10-CM | POA: Insufficient documentation

## 2012-11-18 DIAGNOSIS — N949 Unspecified condition associated with female genital organs and menstrual cycle: Secondary | ICD-10-CM | POA: Insufficient documentation

## 2012-11-18 DIAGNOSIS — R109 Unspecified abdominal pain: Secondary | ICD-10-CM | POA: Insufficient documentation

## 2012-11-18 LAB — URINALYSIS, ROUTINE W REFLEX MICROSCOPIC
Glucose, UA: NEGATIVE mg/dL
Leukocytes, UA: NEGATIVE
Nitrite: NEGATIVE
Specific Gravity, Urine: >1.03 — ABNORMAL HIGH (ref 1.005–1.030)
pH: 5.5 (ref 5.0–8.0)

## 2012-11-18 LAB — WET PREP, GENITAL
Trich, Wet Prep: NONE SEEN
Yeast Wet Prep HPF POC: NONE SEEN

## 2012-11-18 LAB — URINE MICROSCOPIC-ADD ON

## 2012-11-18 LAB — POCT PREGNANCY, URINE: Preg Test, Ur: NEGATIVE

## 2012-11-18 MED ORDER — IBUPROFEN 600 MG PO TABS: 600.0000 mg | ORAL_TABLET | Freq: Four times a day (QID) | ORAL | Status: DC | PRN

## 2012-11-18 NOTE — MAU Provider Note (Signed)
CC: Abdominal Cramping and Vaginal Bleeding    First Provider Initiated Contact with Patient 11/18/12 2026      HPI Katelyn Shaw is a 27 y.o. G1P1001 who presents with onset yesterday of pink vaginal spotting and vaginal itching and irritation. Right suprapubic sharp shooting intermittent pains x 2 days. Last intercourse 1 month ago. No new partner. Stopped OCPs about 4 months ago.  LMP 11/04/12. Denies abnormal bleeding prior to this episode.  Past Medical History  Diagnosis Date  . Trichomonas   . Chlamydia   . Herpes     Type 1, lesion X 1    OB History   Grav Para Term Preterm Abortions TAB SAB Ect Mult Living   1 1 1  0 0 0 0 0 0 1     # Outc Date GA Lbr Len/2nd Wgt Sex Del Anes PTL Lv   1 TRM     M LTCS   Yes   Comments: fetal distress      Past Surgical History  Procedure Laterality Date  . Umbilical hernia    . Cyst removed from r ear    . Hernia repair    . Cesarean section      History   Social History  . Marital Status: Single    Spouse Name: N/A    Number of Children: N/A  . Years of Education: N/A   Occupational History  . Not on file.   Social History Main Topics  . Smoking status: Current Every Day Smoker -- 1.00 packs/day  . Smokeless tobacco: Never Used  . Alcohol Use: Yes     Comment: Occas. once per month  . Drug Use: No  . Sexually Active: Yes    Birth Control/ Protection: None   Other Topics Concern  . Not on file   Social History Narrative  . No narrative on file    No current facility-administered medications on file prior to encounter.   No current outpatient prescriptions on file prior to encounter.    Allergies  Allergen Reactions  . Codeine Hives and Swelling    ROS  Denies dysuria, frequency, urgency, hematuria. No decreased appedtite, N/V/D/C/F.  PHYSICAL EXAM Filed Vitals:   11/18/12 1909  BP: 134/81  Pulse: 93  Temp: 98.4 F (36.9 C)  Resp: 16   General: Well nourished, well developed female in no  acute distress Cardiovascular: Normal rate Respiratory: Normal effort Abdomen: Soft, somewhat TTP rt suprapubic area, no rebound or guarding Back: No CVAT Extremities: No edema Neurologic: Alert and oriented Speculum exam: NEFG; vagina with physiologic discharge, scant amount brown blood, no active bleeding; cervix clean and no contact bleeding Bimanual exam: cervix closed, no CMT; uterus NSSP; minimal rt adnexal tenderness, no masses  LAB RESULTS Results for orders placed during the hospital encounter of 11/18/12 (from the past 24 hour(s))  URINALYSIS, ROUTINE W REFLEX MICROSCOPIC     Status: Abnormal   Collection Time    11/18/12  7:11 PM      Result Value Range   Color, Urine YELLOW  YELLOW   APPearance CLEAR  CLEAR   Specific Gravity, Urine >1.030 (*) 1.005 - 1.030   pH 5.5  5.0 - 8.0   Glucose, UA NEGATIVE  NEGATIVE mg/dL   Hgb urine dipstick MODERATE (*) NEGATIVE   Bilirubin Urine NEGATIVE  NEGATIVE   Ketones, ur NEGATIVE  NEGATIVE mg/dL   Protein, ur NEGATIVE  NEGATIVE mg/dL   Urobilinogen, UA 0.2  0.0 - 1.0 mg/dL  Nitrite NEGATIVE  NEGATIVE   Leukocytes, UA NEGATIVE  NEGATIVE  URINE MICROSCOPIC-ADD ON     Status: Abnormal   Collection Time    11/18/12  7:11 PM      Result Value Range   Squamous Epithelial / LPF FEW (*) RARE   WBC, UA 0-2  <3 WBC/hpf   RBC / HPF 0-2  <3 RBC/hpf   Bacteria, UA RARE  RARE  POCT PREGNANCY, URINE     Status: None   Collection Time    11/18/12  7:21 PM      Result Value Range   Preg Test, Ur NEGATIVE  NEGATIVE  WET PREP, GENITAL     Status: Abnormal   Collection Time    11/18/12  8:35 PM      Result Value Range   Yeast Wet Prep HPF POC NONE SEEN  NONE SEEN   Trich, Wet Prep NONE SEEN  NONE SEEN   Clue Cells Wet Prep HPF POC NONE SEEN  NONE SEEN   WBC, Wet Prep HPF POC FEW (*) NONE SEEN    IMAGING     *RADIOLOGY REPORT*  Clinical Data: Quadrant pain and bleeding. LMP 11/04/2012.  TRANSABDOMINAL AND TRANSVAGINAL ULTRASOUND OF  PELVIS  Technique: Both transabdominal and transvaginal ultrasound  examinations of the pelvis were performed. Transabdominal technique  was performed for global imaging of the pelvis including uterus,  ovaries, adnexal regions, and pelvic cul-de-sac.  It was necessary to proceed with endovaginal exam following the  transabdominal exam to visualize the uterus, endometrium, and  ovaries.  Comparison: None  Findings:  Uterus: Uterus is anteverted and measures 8.8 x 5.2 x 5.9 cm. No  focal uterine mass is identified.  Endometrium: The endometrium measures a maximum 9.3 mm, within  normal limits. The endometrium appears homogeneous.  Right ovary: Normal appearance/no adnexal mass. Right ovary  measures 2.6 x 1.4 x 3.8 cm.  Left ovary: Normal appearance/no adnexal mass. Left ovary measures  2.1 x 1.5 x 1.9 cm.  Other findings: There is a trace amount of free pelvic fluid.  IMPRESSION:  Normal study. No evidence of pelvic mass or other significant  abnormality.      MAU COURSE GC/CT pending> will tx if indicated  ASSESSMENT  1. Adnexal pain   2. DUB (dysfunctional uterine bleeding)     PLAN Discharge home. See AVS for patient education.    Follow-up Information   Schedule an appointment as soon as possible for a visit with [facility]. (Make appointmnet if your abdominal pain persists after your next period and an appointment for contraception.)    Contact information:   [address] [place] [address] [phone]

## 2012-11-18 NOTE — MAU Note (Signed)
Red spotting since yesterday. RLQ cramping since yesterday. LMP 11/04/12, was normal for her, has regular period. Vaginal irritation & itching x2 days. Denies vaginal discharge.

## 2012-11-20 LAB — GC/CHLAMYDIA PROBE AMP: CT Probe RNA: NEGATIVE

## 2013-01-21 ENCOUNTER — Encounter (HOSPITAL_COMMUNITY): Payer: Self-pay | Admitting: *Deleted

## 2013-01-21 ENCOUNTER — Inpatient Hospital Stay (HOSPITAL_COMMUNITY)
Admission: AD | Admit: 2013-01-21 | Discharge: 2013-01-21 | Disposition: A | Discharge status: Home / Self Care | Payer: Medicaid Other | Source: Ambulatory Visit | Attending: Obstetrics & Gynecology | Admitting: Obstetrics & Gynecology

## 2013-01-21 DIAGNOSIS — N949 Unspecified condition associated with female genital organs and menstrual cycle: Secondary | ICD-10-CM | POA: Insufficient documentation

## 2013-01-21 DIAGNOSIS — B9689 Other specified bacterial agents as the cause of diseases classified elsewhere: Secondary | ICD-10-CM | POA: Insufficient documentation

## 2013-01-21 DIAGNOSIS — N760 Acute vaginitis: Secondary | ICD-10-CM | POA: Insufficient documentation

## 2013-01-21 DIAGNOSIS — A499 Bacterial infection, unspecified: Secondary | ICD-10-CM | POA: Insufficient documentation

## 2013-01-21 MED ORDER — METRONIDAZOLE 500 MG PO TABS: 500.0000 mg | ORAL_TABLET | Freq: Two times a day (BID) | ORAL | Status: DC

## 2013-01-21 NOTE — MAU Provider Note (Signed)
  History     CSN: [id number]  Arrival date and time: 01/21/13 1845   None     Chief Complaint  Patient presents with  . Vaginal Discharge   HPI Pt is not pregnant and presents with white vaginal discharge with odor.  Pt c/o of slight itching. Pt denies pelvic pain, UTI symptoms or constipation or diarrhea. Pt has hx of trichomonas, chlamydia and herpes. RN note: Irregular discharge, has an odor to it. Started on Sunday. No pain or burning, does have slight itching.   Past Medical History  Diagnosis Date  . Trichomonas   . Chlamydia   . Herpes     Type 1, lesion X 1    Past Surgical History  Procedure Laterality Date  . Umbilical hernia    . Cyst removed from r ear    . Hernia repair    . Cesarean section      Family History  Problem Relation Age of Onset  . Anesthesia problems Neg Hx     History  Substance Use Topics  . Smoking status: Current Every Day Smoker -- 1.00 packs/day  . Smokeless tobacco: Never Used  . Alcohol Use: Yes     Comment: Occas. once per month    Allergies:  Allergies  Allergen Reactions  . Codeine Hives and Swelling    Prescriptions prior to admission  Medication Sig Dispense Refill  . acetaminophen (TYLENOL) 500 MG tablet Take 1,000 mg by mouth every 6 (six) hours as needed for pain.      Marland Kitchen ibuprofen (ADVIL,MOTRIN) 600 MG tablet Take 1 tablet (600 mg total) by mouth every 6 (six) hours as needed for pain.  30 tablet  1    Review of Systems  Constitutional: Negative for fever and chills.  Gastrointestinal: Negative for nausea, vomiting, abdominal pain and diarrhea.  Genitourinary: Negative for dysuria.   Physical Exam   Blood pressure 109/59, pulse 91, temperature 99.1 F (37.3 C), temperature source Oral, resp. rate 18, weight 74.39 kg (164 lb), last menstrual period 12/31/2012.  Physical Exam  Nursing note and vitals reviewed. Constitutional: She is oriented to person, place, and time. She appears well-developed and  well-nourished. No distress.  HENT:  Head: Normocephalic.  Eyes: Pupils are equal, round, and reactive to light.  Neck: Normal range of motion. Neck supple.  Cardiovascular: Normal rate.   Respiratory: Effort normal.  GI: Soft. She exhibits no distension. There is no tenderness. There is no rebound.  Genitourinary:  Mod-large amount of frothy white discharge in vault; cervix clean, nontender; uterus NSSC NT; adnexa without palpable enlargement or tenderness  Musculoskeletal: Normal range of motion.  Neurological: She is alert and oriented to person, place, and time.  Skin: Skin is warm and dry.  Psychiatric: She has a normal mood and affect.    MAU Course  Procedures Results for orders placed during the hospital encounter of 01/21/13 (from the past 24 hour(s))  WET PREP, GENITAL     Status: Abnormal   Collection Time    01/21/13  7:26 PM      Result Value Range   Yeast Wet Prep HPF POC NONE SEEN  NONE SEEN   Trich, Wet Prep NONE SEEN  NONE SEEN   Clue Cells Wet Prep HPF POC MODERATE (*) NONE SEEN   WBC, Wet Prep HPF POC FEW (*) NONE SEEN  GC/chlamydia pending  Assessment and Plan  Bacterial vaginosis- Flagyl 500mg  BID for 7 days LINEBERRY,SUSAN 01/21/2013, 7:19 PM

## 2013-01-21 NOTE — MAU Note (Signed)
Irregular discharge, has an odor to it.  Started on Sunday. No pain or burning, does have slight itching.

## 2013-01-22 LAB — GC/CHLAMYDIA PROBE AMP: GC Probe RNA: NEGATIVE

## 2013-06-04 ENCOUNTER — Inpatient Hospital Stay (HOSPITAL_COMMUNITY)
Admission: AD | Admit: 2013-06-04 | Discharge: 2013-06-04 | Disposition: A | Discharge status: Home / Self Care | Payer: Medicaid Other | Source: Ambulatory Visit | Attending: Obstetrics & Gynecology | Admitting: Obstetrics & Gynecology

## 2013-06-04 ENCOUNTER — Encounter (HOSPITAL_COMMUNITY): Payer: Self-pay | Admitting: Family

## 2013-06-04 DIAGNOSIS — A499 Bacterial infection, unspecified: Secondary | ICD-10-CM | POA: Insufficient documentation

## 2013-06-04 DIAGNOSIS — N760 Acute vaginitis: Secondary | ICD-10-CM | POA: Insufficient documentation

## 2013-06-04 DIAGNOSIS — R109 Unspecified abdominal pain: Secondary | ICD-10-CM

## 2013-06-04 DIAGNOSIS — N949 Unspecified condition associated with female genital organs and menstrual cycle: Secondary | ICD-10-CM | POA: Insufficient documentation

## 2013-06-04 DIAGNOSIS — B9689 Other specified bacterial agents as the cause of diseases classified elsewhere: Secondary | ICD-10-CM | POA: Insufficient documentation

## 2013-06-04 DIAGNOSIS — N939 Abnormal uterine and vaginal bleeding, unspecified: Secondary | ICD-10-CM

## 2013-06-04 DIAGNOSIS — N926 Irregular menstruation, unspecified: Secondary | ICD-10-CM | POA: Insufficient documentation

## 2013-06-04 LAB — URINALYSIS, ROUTINE W REFLEX MICROSCOPIC
Ketones, ur: NEGATIVE mg/dL
Leukocytes, UA: NEGATIVE
Nitrite: NEGATIVE
Specific Gravity, Urine: >1.03 — ABNORMAL HIGH (ref 1.005–1.030)
Urobilinogen, UA: 1 mg/dL (ref 0.0–1.0)
pH: 6 (ref 5.0–8.0)

## 2013-06-04 LAB — WET PREP, GENITAL
Trich, Wet Prep: NONE SEEN
Yeast Wet Prep HPF POC: NONE SEEN

## 2013-06-04 MED ORDER — METRONIDAZOLE 500 MG PO TABS: 500.0000 mg | ORAL_TABLET | Freq: Two times a day (BID) | ORAL | Status: DC

## 2013-06-04 NOTE — MAU Provider Note (Signed)
History     CSN: [id number]  Arrival date and time: 06/04/13 1552   First Provider Initiated Contact with Patient 06/04/13 1729      Chief Complaint  Patient presents with  . Vaginal Discharge   HPI Katelyn Shaw is 27 y.o. G1P1001 presenting with abdominal pain X 3 weeks.  States her period was normal but 3 days afterwards she began spotting X 10 days.  It began prior to her LMP of 05/20/13 and has continued.  Tylenol not helpful.  1 sexual partner X 2 years off and on.  Condoms for contraception.  Condom broke last month.  Past Medical History  Diagnosis Date  . Trichomonas   . Chlamydia   . Herpes     Type 1, lesion X 1    Past Surgical History  Procedure Laterality Date  . Umbilical hernia    . Cyst removed from r ear    . Hernia repair    . Cesarean section      Family History  Problem Relation Age of Onset  . Anesthesia problems Neg Hx     History  Substance Use Topics  . Smoking status: Current Every Day Smoker -- 1.00 packs/day  . Smokeless tobacco: Never Used  . Alcohol Use: Yes     Comment: Occas. once per month    Allergies:  Allergies  Allergen Reactions  . Codeine Hives and Swelling    Prescriptions prior to admission  Medication Sig Dispense Refill  . acetaminophen (TYLENOL) 500 MG tablet Take 1,000 mg by mouth every 6 (six) hours as needed for pain.        Review of Systems  Constitutional: Negative for fever and chills.  Gastrointestinal: Positive for abdominal pain. Negative for nausea and vomiting.  Genitourinary: Negative for dysuria, urgency, frequency and hematuria.       Spotting on and off Neg for vaginal discharge  Neurological: Negative for headaches.   Physical Exam   Blood pressure 112/66, pulse 84, temperature 98 F (36.7 C), temperature source Oral, resp. rate 18, height 5\' 5"  (1.651 m), weight 163 lb (73.936 kg), last menstrual period 05/20/2013.  Physical Exam  Constitutional: She is oriented to person, place, and  time. She appears well-developed and well-nourished. No distress.  HENT:  Head: Normocephalic.  Neck: Normal range of motion.  Cardiovascular: Normal rate.   Respiratory: Effort normal.  GI: Soft. She exhibits no distension and no mass. There is no tenderness. There is no rebound and no guarding.  Genitourinary: There is no rash, tenderness or lesion on the right labia. There is no rash, tenderness or lesion on the left labia. Uterus is not enlarged and not tender. Cervix exhibits no motion tenderness, no discharge and no friability. Right adnexum displays no mass, no tenderness and no fullness. Left adnexum displays no mass, no tenderness and no fullness. No bleeding around the vagina. Vaginal discharge (moderate amount of yellow, foul smelling discharge) found.  Neurological: She is alert and oriented to person, place, and time.  Skin: Skin is warm and dry.  Psychiatric: She has a normal mood and affect. Her behavior is normal.   Results for orders placed during the hospital encounter of 06/04/13 (from the past 24 hour(s))  URINALYSIS, ROUTINE W REFLEX MICROSCOPIC     Status: Abnormal   Collection Time    06/04/13  4:25 PM      Result Value Range   Color, Urine YELLOW  YELLOW   APPearance HAZY (*) CLEAR  Specific Gravity, Urine >1.030 (*) 1.005 - 1.030   pH 6.0  5.0 - 8.0   Glucose, UA NEGATIVE  NEGATIVE mg/dL   Hgb urine dipstick NEGATIVE  NEGATIVE   Bilirubin Urine NEGATIVE  NEGATIVE   Ketones, ur NEGATIVE  NEGATIVE mg/dL   Protein, ur NEGATIVE  NEGATIVE mg/dL   Urobilinogen, UA 1.0  0.0 - 1.0 mg/dL   Nitrite NEGATIVE  NEGATIVE   Leukocytes, UA NEGATIVE  NEGATIVE  POCT PREGNANCY, URINE     Status: None   Collection Time    06/04/13  4:58 PM      Result Value Range   Preg Test, Ur NEGATIVE  NEGATIVE  WET PREP, GENITAL     Status: Abnormal   Collection Time    06/04/13  5:40 PM      Result Value Range   Yeast Wet Prep HPF POC NONE SEEN  NONE SEEN   Trich, Wet Prep NONE SEEN   NONE SEEN   Clue Cells Wet Prep HPF POC MODERATE (*) NONE SEEN   WBC, Wet Prep HPF POC FEW (*) NONE SEEN    MAU Course  ProceduresGC/CHL culture to lab  MDM   Assessment and Plan  A:  Abdominal pain      Irregular vaginal bleeding     Bacterial vaginosis  P:  Rx for Flagyl 500mg  bid X 1 week     Pelvic rest and no ETOH X week of treatment     Cultures pending      CONDOMS always  KEY,EVE M 06/04/2013, 6:23 PM

## 2013-06-04 NOTE — MAU Note (Signed)
Noted vag burning prior to onset of period.  Cycle came and went, burning returned, now has a discharge and has been spotting

## 2013-06-04 NOTE — MAU Note (Addendum)
27 yo, G1P1, LMP 05/20/13; reports irregular bleeding after cycle with intermittent lower abdominal cramping. Reports condom use for contraception, although last sexual encounter condom broke.  Denies changes in medications, diet. Only recent change reported is more stress at work lately.

## 2013-06-05 LAB — GC/CHLAMYDIA PROBE AMP: GC Probe RNA: NEGATIVE

## 2013-09-07 ENCOUNTER — Encounter (HOSPITAL_COMMUNITY): Payer: Self-pay

## 2013-09-07 ENCOUNTER — Inpatient Hospital Stay (HOSPITAL_COMMUNITY)
Admission: AD | Admit: 2013-09-07 | Discharge: 2013-09-07 | Disposition: A | Discharge status: Home / Self Care | Payer: Self-pay | Source: Ambulatory Visit | Attending: Obstetrics and Gynecology | Admitting: Obstetrics and Gynecology

## 2013-09-07 DIAGNOSIS — N760 Acute vaginitis: Secondary | ICD-10-CM

## 2013-09-07 DIAGNOSIS — A499 Bacterial infection, unspecified: Secondary | ICD-10-CM

## 2013-09-07 DIAGNOSIS — L293 Anogenital pruritus, unspecified: Secondary | ICD-10-CM | POA: Insufficient documentation

## 2013-09-07 DIAGNOSIS — N949 Unspecified condition associated with female genital organs and menstrual cycle: Secondary | ICD-10-CM | POA: Insufficient documentation

## 2013-09-07 DIAGNOSIS — B3731 Acute candidiasis of vulva and vagina: Secondary | ICD-10-CM | POA: Insufficient documentation

## 2013-09-07 DIAGNOSIS — B373 Candidiasis of vulva and vagina: Secondary | ICD-10-CM | POA: Insufficient documentation

## 2013-09-07 DIAGNOSIS — F17200 Nicotine dependence, unspecified, uncomplicated: Secondary | ICD-10-CM | POA: Insufficient documentation

## 2013-09-07 DIAGNOSIS — B9689 Other specified bacterial agents as the cause of diseases classified elsewhere: Secondary | ICD-10-CM

## 2013-09-07 LAB — CBC
MCV: 88.8 fL (ref 78.0–100.0)
Platelets: 302 10*3/uL (ref 150–400)
RBC: 4.11 MIL/uL (ref 3.87–5.11)
RDW: 14.2 % (ref 11.5–15.5)
WBC: 7.8 10*3/uL (ref 4.0–10.5)

## 2013-09-07 LAB — URINALYSIS, ROUTINE W REFLEX MICROSCOPIC
Bilirubin Urine: NEGATIVE
Nitrite: NEGATIVE
Specific Gravity, Urine: >1.03 — ABNORMAL HIGH (ref 1.005–1.030)
pH: 5.5 (ref 5.0–8.0)

## 2013-09-07 LAB — WET PREP, GENITAL
Trich, Wet Prep: NONE SEEN
Yeast Wet Prep HPF POC: NONE SEEN

## 2013-09-07 LAB — POCT PREGNANCY, URINE: Preg Test, Ur: NEGATIVE

## 2013-09-07 MED ORDER — METRONIDAZOLE 500 MG PO TABS: 500.0000 mg | ORAL_TABLET | Freq: Two times a day (BID) | ORAL | Status: DC

## 2013-09-07 MED ORDER — FLUCONAZOLE 150 MG PO TABS
150.0000 mg | ORAL_TABLET | Freq: Every day | ORAL | Status: DC
  Administered 2013-09-07: 150 mg via ORAL
  Filled 2013-09-07: qty 1

## 2013-09-07 MED ORDER — FLUCONAZOLE 150 MG PO TABS: 150.0000 mg | ORAL_TABLET | Freq: Every day | ORAL | Status: DC

## 2013-09-07 NOTE — MAU Provider Note (Signed)
History     CSN: [id number]  Arrival date and time: 09/07/13 5573   First Provider Initiated Contact with Patient 09/07/13 1046      Chief Complaint  Patient presents with  . Vaginal Pain   HPI  Ms. Katelyn Shaw is a 27 y.o. female G1P1001 who presents with vaginal irritation/ vaginal pain. She [facility] and feels her symptoms are worse; she took the prescribed 7 day flagyl.  Symptoms include vaginal discharge, odor, burning on the outside of her vagina, and vaginal itching.   OB History   Grav Para Term Preterm Abortions TAB SAB Ect Mult Living   1 1 1  0 0 0 0 0 0 1      Past Medical History  Diagnosis Date  . Trichomonas   . Chlamydia   . Herpes     Type 1, lesion X 1    Past Surgical History  Procedure Laterality Date  . Umbilical hernia    . Cyst removed from r ear    . Hernia repair    . Cesarean section      Family History  Problem Relation Age of Onset  . Anesthesia problems Neg Hx     History  Substance Use Topics  . Smoking status: Current Every Day Smoker -- 1.00 packs/day    Types: Cigarettes  . Smokeless tobacco: Never Used  . Alcohol Use: Yes     Comment: Occas. once per month    Allergies:  Allergies  Allergen Reactions  . Codeine Hives and Swelling    Prescriptions prior to admission  Medication Sig Dispense Refill  . acetaminophen (TYLENOL) 500 MG tablet Take 1,000 mg by mouth every 6 (six) hours as needed for pain.       Results for orders placed during the hospital encounter of 09/07/13 (from the past 48 hour(s))  URINALYSIS, ROUTINE W REFLEX MICROSCOPIC     Status: Abnormal   Collection Time    09/07/13 10:25 AM      Result Value Range   Color, Urine YELLOW  YELLOW   APPearance CLEAR  CLEAR   Specific Gravity, Urine >1.030 (*) 1.005 - 1.030   pH 5.5  5.0 - 8.0   Glucose, UA NEGATIVE  NEGATIVE mg/dL   Hgb urine dipstick NEGATIVE  NEGATIVE   Bilirubin Urine NEGATIVE  NEGATIVE    Ketones, ur NEGATIVE  NEGATIVE mg/dL   Protein, ur NEGATIVE  NEGATIVE mg/dL   Urobilinogen, UA 0.2  0.0 - 1.0 mg/dL   Nitrite NEGATIVE  NEGATIVE   Leukocytes, UA NEGATIVE  NEGATIVE   Comment: MICROSCOPIC NOT DONE ON URINES WITH NEGATIVE PROTEIN, BLOOD, LEUKOCYTES, NITRITE, OR GLUCOSE <1000 mg/dL.  POCT PREGNANCY, URINE     Status: None   Collection Time    09/07/13 10:36 AM      Result Value Range   Preg Test, Ur NEGATIVE  NEGATIVE   Comment:            THE SENSITIVITY OF THIS     METHODOLOGY IS >24 mIU/mL  CBC     Status: None   Collection Time    09/07/13 11:00 AM      Result Value Range   WBC 7.8  4.0 - 10.5 K/uL   RBC 4.11  3.87 - 5.11 MIL/uL   Hemoglobin 12.4  12.0 - 15.0 g/dL   HCT 22.0  25.4 - 27.0 %   MCV 88.8  78.0 -  100.0 fL   MCH 30.2  26.0 - 34.0 pg   MCHC 34.0  30.0 - 36.0 g/dL   RDW 52.8  41.3 - 24.4 %   Platelets 302  150 - 400 K/uL  WET PREP, GENITAL     Status: Abnormal   Collection Time    09/07/13 11:00 AM      Result Value Range   Yeast Wet Prep HPF POC NONE SEEN  NONE SEEN   Trich, Wet Prep NONE SEEN  NONE SEEN   Clue Cells Wet Prep HPF POC MODERATE (*) NONE SEEN   WBC, Wet Prep HPF POC FEW (*) NONE SEEN   Comment: MANY BACTERIA SEEN    Review of Systems  Constitutional: Negative for fever and chills.  Gastrointestinal: Positive for nausea, abdominal pain and constipation. Negative for vomiting and diarrhea.       Bilateral lower abdominal pain   Genitourinary: Negative for dysuria, urgency, frequency, hematuria and flank pain.       + vaginal discharge; thick, white, malodorous  No vaginal bleeding. No dysuria.    Physical Exam   Blood pressure 113/71, pulse 79, temperature 98.1 F (36.7 C), temperature source Oral, resp. rate 18, last menstrual period 08/15/2013.  Physical Exam  Constitutional: She is oriented to person, place, and time. She appears well-developed and well-nourished. No distress.  HENT:  Head: Normocephalic.  Eyes:  Pupils are equal, round, and reactive to light.  Neck: Neck supple.  Respiratory: Effort normal.  GI: Soft. She exhibits no distension and no mass. There is tenderness. There is no rebound and no guarding.  +suprapubic tenderness   Genitourinary:  Speculum exam: Vagina - Moderate amount of creamy, white discharge, mild odor, erythema noted along vaginal wall.  Cervix - No contact bleeding Bimanual exam: Cervix closed, No CMT  Uterus non tender, normal size Adnexa non tender, no masses bilaterally GC/Chlam, wet prep done Chaperone present for exam.   Musculoskeletal: Normal range of motion.  Neurological: She is alert and oriented to person, place, and time.  Skin: Skin is warm. She is not diaphoretic.    MAU Course  Procedures None  MDM CBC Wet prep GC/Chlamydia  CBC Diflucan 150 mg PO Dicussed flagyl pills vs. Metrogel. Given the cost of metrogel patient would like to try flagyl again.  Discussed with the patient if another relapse in treatment occurs we may have to add vaginal boric acid.   Assessment and Plan   A: Bacterial vaginosis  Presumed yeast vaginitis   P: Discharge home RX: Flagyl- no alcohol        Diflucan  Increase water intake Return to MAU as needed, if symptoms worsen   Iona Hansen Rasch, NP 09/07/2013, 10:46 AM

## 2013-09-07 NOTE — MAU Note (Signed)
Pt presents complaining of vaginal pain and burning. States she [facility] ago and given a prescription for bacterial vaginosis but it made the symptoms worse after finishing the medication. Denies vaginal bleeding. Complains of white/gray discharge.

## 2013-09-08 NOTE — MAU Provider Note (Signed)
Attestation of Attending Supervision of Advanced Practitioner (CNM/NP): Evaluation and management procedures were performed by the Advanced Practitioner under my supervision and collaboration.  I have reviewed the Advanced Practitioner's note and chart, and I agree with the management and plan.  Hamsa Laurich 09/08/2013 10:49 AM

## 2013-09-23 ENCOUNTER — Inpatient Hospital Stay (HOSPITAL_COMMUNITY)
Admission: AD | Admit: 2013-09-23 | Discharge: 2013-09-23 | Disposition: A | Discharge status: Home / Self Care | Payer: Self-pay | Source: Ambulatory Visit | Attending: Family Medicine | Admitting: Family Medicine

## 2013-09-23 ENCOUNTER — Encounter (HOSPITAL_COMMUNITY): Payer: Self-pay | Admitting: *Deleted

## 2013-09-23 DIAGNOSIS — F17200 Nicotine dependence, unspecified, uncomplicated: Secondary | ICD-10-CM | POA: Insufficient documentation

## 2013-09-23 DIAGNOSIS — B9689 Other specified bacterial agents as the cause of diseases classified elsewhere: Secondary | ICD-10-CM

## 2013-09-23 DIAGNOSIS — N760 Acute vaginitis: Secondary | ICD-10-CM

## 2013-09-23 DIAGNOSIS — A499 Bacterial infection, unspecified: Secondary | ICD-10-CM | POA: Insufficient documentation

## 2013-09-23 LAB — WET PREP, GENITAL
Trich, Wet Prep: NONE SEEN
YEAST WET PREP: NONE SEEN

## 2013-09-23 LAB — POCT PREGNANCY, URINE: Preg Test, Ur: NEGATIVE

## 2013-09-23 MED ORDER — CLINDAMYCIN HCL 300 MG PO CAPS: 300.0000 mg | ORAL_CAPSULE | Freq: Two times a day (BID) | ORAL | Status: DC

## 2013-09-23 MED ORDER — BORIC ACID POWD: 600.0000 mg | Freq: Every day | Status: DC

## 2013-09-23 NOTE — Discharge Instructions (Signed)

## 2013-09-23 NOTE — MAU Provider Note (Signed)
History     CSN: [id number]  Arrival date and time: 09/23/13 0044   First Provider Initiated Contact with Patient 09/23/13 0131      Chief Complaint  Patient presents with  . Vaginal Discharge   HPI  Katelyn Shaw is a 28 y.o. G1P1001 who presents today with continued BV symptoms. She states that she has been treated for BV three times in the last year, and it keeps returning.   Past Medical History  Diagnosis Date  . Trichomonas   . Chlamydia   . Herpes     Type 1, lesion X 1    Past Surgical History  Procedure Laterality Date  . Umbilical hernia    . Cyst removed from r ear    . Hernia repair    . Cesarean section      Family History  Problem Relation Age of Onset  . Anesthesia problems Neg Hx     History  Substance Use Topics  . Smoking status: Current Every Day Smoker -- 1.00 packs/day    Types: Cigarettes  . Smokeless tobacco: Never Used  . Alcohol Use: Yes     Comment: Occas. once per month    Allergies:  Allergies  Allergen Reactions  . Codeine Hives and Swelling    Prescriptions prior to admission  Medication Sig Dispense Refill  . fluconazole (DIFLUCAN) 150 MG tablet Take 1 tablet (150 mg total) by mouth daily.  1 tablet  0  . metroNIDAZOLE (FLAGYL) 500 MG tablet Take 1 tablet (500 mg total) by mouth 2 (two) times daily.  14 tablet  0    ROS Physical Exam   Blood pressure 116/74, pulse 71, temperature 98.6 F (37 C), temperature source Oral, resp. rate 18, last menstrual period 09/12/2013.  Physical Exam  Nursing note and vitals reviewed. Constitutional: She is oriented to person, place, and time. She appears well-developed and well-nourished. No distress.  Cardiovascular: Normal rate.   Respiratory: Effort normal.  GI: Soft. There is no tenderness.  Neurological: She is alert and oriented to person, place, and time.  Skin: Skin is warm and dry.  Psychiatric: She has a normal mood and affect.    MAU Course  Procedures  Results  for orders placed during the hospital encounter of 09/23/13 (from the past 24 hour(s))  POCT PREGNANCY, URINE     Status: None   Collection Time    09/23/13  1:21 AM      Result Value Range   Preg Test, Ur NEGATIVE  NEGATIVE  WET PREP, GENITAL     Status: Abnormal   Collection Time    09/23/13  1:35 AM      Result Value Range   Yeast Wet Prep HPF POC NONE SEEN  NONE SEEN   Trich, Wet Prep NONE SEEN  NONE SEEN   Clue Cells Wet Prep HPF POC FEW (*) NONE SEEN   WBC, Wet Prep HPF POC FEW (*) NONE SEEN    Per UpToDate: treat with a longer course of a different antibiotic, and add 600mg  boric acid PV at HS for 21 days.   Assessment and Plan   1. Bacterial vaginal infection      Medication List         Boric Acid Powd  Place 600 mg vaginally at bedtime. For 21 nights     clindamycin 300 MG capsule  Commonly known as:  CLEOCIN  Take 1 capsule (300 mg total) by mouth 2 (two) times daily.  fluconazole 150 MG tablet  Commonly known as:  DIFLUCAN  Take 1 tablet (150 mg total) by mouth daily.     metroNIDAZOLE 500 MG tablet  Commonly known as:  FLAGYL  Take 1 tablet (500 mg total) by mouth 2 (two) times daily.       Follow-up Information   Follow up with [facility] [place]. (As needed)    Contact information:   [place] [address] [phone]       Hargan Bud 09/23/2013, 1:33 AM

## 2013-09-23 NOTE — MAU Provider Note (Signed)
Attestation of Attending Supervision of Advanced Practitioner (PA/CNM/NP): Evaluation and management procedures were performed by the Advanced Practitioner under my supervision and collaboration.  I have reviewed the Advanced Practitioner's note and chart, and I agree with the management and plan.  Donnamae Jude, MD [facility] Attending 09/23/2013 7:20 AM

## 2013-09-23 NOTE — MAU Note (Signed)
Pt has taken medication for BV, continues to have discharge with odor.

## 2013-10-01 ENCOUNTER — Other Ambulatory Visit: Payer: Self-pay | Admitting: Medical

## 2013-10-01 DIAGNOSIS — B373 Candidiasis of vulva and vagina: Secondary | ICD-10-CM

## 2013-10-01 DIAGNOSIS — B3731 Acute candidiasis of vulva and vagina: Secondary | ICD-10-CM

## 2013-10-01 MED ORDER — FLUCONAZOLE 150 MG PO TABS: 150.0000 mg | ORAL_TABLET | Freq: Every day | ORAL | Status: DC

## 2013-10-01 NOTE — Progress Notes (Signed)
Patient presents to MAU requesting refill for Diflucan. Patient is currently taking antibiotics for BV and using boric acid. The Rx has been sent to the patient's pharmacy. Patient advised to follow-up with [facility] if symptoms persist as this will be the last refill given without re-evaluation.  Farris Has, PA-C 10/01/2013 3:13 PM

## 2013-12-04 ENCOUNTER — Emergency Department (HOSPITAL_COMMUNITY)
Admission: EM | Admit: 2013-12-04 | Discharge: 2013-12-04 | Disposition: A | Discharge status: Home / Self Care | Payer: Self-pay | Attending: Emergency Medicine | Admitting: Emergency Medicine

## 2013-12-04 ENCOUNTER — Encounter (HOSPITAL_COMMUNITY): Payer: Self-pay | Admitting: Emergency Medicine

## 2013-12-04 DIAGNOSIS — Z8619 Personal history of other infectious and parasitic diseases: Secondary | ICD-10-CM | POA: Insufficient documentation

## 2013-12-04 DIAGNOSIS — J0100 Acute maxillary sinusitis, unspecified: Secondary | ICD-10-CM | POA: Insufficient documentation

## 2013-12-04 DIAGNOSIS — F17200 Nicotine dependence, unspecified, uncomplicated: Secondary | ICD-10-CM | POA: Insufficient documentation

## 2013-12-04 DIAGNOSIS — Z79899 Other long term (current) drug therapy: Secondary | ICD-10-CM | POA: Insufficient documentation

## 2013-12-04 MED ORDER — OXYCODONE-ACETAMINOPHEN 5-325 MG PO TABS
1.0000 | ORAL_TABLET | Freq: Once | ORAL | Status: AC
  Administered 2013-12-04: 1 via ORAL
  Filled 2013-12-04: qty 1

## 2013-12-04 MED ORDER — PSEUDOEPHEDRINE HCL 30 MG/5ML PO SYRP: 60.0000 mg | ORAL_SOLUTION | Freq: Four times a day (QID) | ORAL | Status: DC | PRN

## 2013-12-04 MED ORDER — TRAMADOL HCL 50 MG PO TABS: 50.0000 mg | ORAL_TABLET | Freq: Four times a day (QID) | ORAL | Status: DC | PRN

## 2013-12-04 MED ORDER — IBUPROFEN 800 MG PO TABS
800.0000 mg | ORAL_TABLET | Freq: Once | ORAL | Status: AC
  Administered 2013-12-04: 800 mg via ORAL
  Filled 2013-12-04: qty 1

## 2013-12-04 MED ORDER — AMOXICILLIN 500 MG PO CAPS: 500.0000 mg | ORAL_CAPSULE | Freq: Three times a day (TID) | ORAL | Status: DC

## 2013-12-04 NOTE — Discharge Instructions (Signed)
°Emergency Department Resource Guide °1) Find a Doctor and Pay Out of Pocket °Although you won't have to find out who is covered by your insurance plan, it is a good idea to ask around and get recommendations. You will then need to call the office and see if the doctor you have chosen will accept you as a new patient and what types of options they offer for patients who are self-pay. Some doctors offer discounts or will set up payment plans for their patients who do not have insurance, but you will need to ask so you aren't surprised when you get to your appointment. ° °2) Contact Your Local Health Department °Not all health departments have doctors that can see patients for sick visits, but many do, so it is worth a call to see if yours does. If you don't know where your local health department is, you can check in your phone book. The CDC also has a tool to help you locate your state's health department, and many state websites also have listings of all of their local health departments. ° °3) Find a Walk-in Clinic °If your illness is not likely to be very severe or complicated, you may want to try a walk in clinic. These are popping up all over the country in pharmacies, drugstores, and shopping centers. They're usually staffed by nurse practitioners or physician assistants that have been trained to treat common illnesses and complaints. They're usually fairly quick and inexpensive. However, if you have serious medical issues or chronic medical problems, these are probably not your best option. ° °No Primary Care Doctor: °- Call [facility] at  832-8000 - they can help you locate a primary care doctor that  accepts your insurance, provides certain services, etc. °- Physician Referral Service- 1-800-533-3463 ° °Chronic Pain Problems: °Organization         Address  Phone   Notes  °[facility]  (336) 297-2271 Patients need to be referred by their primary care doctor.  ° °Medication  Assistance: °Organization         Address  Phone   Notes  °[place] [facility] [address] °[place], [place] [address] (336) 641-8030 --Must be a resident of [place] °-- Must have NO insurance coverage whatsoever (no Medicaid/ Medicare, etc.) °-- The pt. MUST have a primary care doctor that directs their care regularly and follows them in the community °  °[facility]  (866) 331-1348   °[employer]  (888) 892-1162   ° °Agencies that provide inexpensive medical care: °Organization         Address  Phone   Notes  °[facility]  (336) 832-8035   ° Internal Medicine    (336) 832-7272   °[facility] [address] [place] [address] °[place], [place] [address] (336) 832-4777   °[facility] [address], °[place] (336) 271-4999   °[facility]    (336) 373-0678   °[facility]    (336) 272-1050   °[facility] ° [address], [place] Phone:  (336) 832-4444, Fax:  (336) 832-4440 Hours of Operation:  9 am - 6 pm, M-F.  Also accepts Medicaid/Medicare and self-pay.  ° [facility] ° [address],  Phone: (336) 832-3150, Fax: (336) 832-3151. Hours of Operation:  8:30 am - 5:30 pm, M-F.  Also accepts Medicaid and self-pay.  °[facility] [place] [address], [place] Phone: (336) 878-6027   °[facility] [address], [place], [place] (336)723-1848, Ext. 123 Mondays & Thursdays: 7-9 AM.  First 15 patients are seen on a first come, first serve basis. °  ° °Medicaid-accepting [place] Providers: ° °Organization         Address  Phone   Notes  °[facility] [address], [place] (336) 641-2100 Also accepts self-pay patients.  °[facility] [address], [place] ° (336) 856-9996   °[facility] [address], [place]  (336) 288-8857   °[facility] [address] [place] [address], [place] (336) 299-7000   °Veita Bland [address], [place]  ° (336) 373-1557 Only accepts [facility] Medicaid patients after they have their name applied to their card.  ° °Self-Pay (no insurance) in [place]: ° °Organization         Address  Phone   Notes  °Sickle Cell Patients, [facility] [address], [place] (336) 832-1970   °[facility] [address], [place] (336) 832-4400   ° [facility] ° [address], [place] (336) 992-4800   °[facility]/Dr. Osei-Bonsu ° [address] [place] [address], [place] or [address], [place] (336) 841-8500 Phone number for both [place] and [place] locations is the same.  °[facility] [address], [place] (336) 299-0000   °[facility] [address] [place] [address], [place] or [address] (336) 852-7530 °(336) 878-2260   °[facility] [address], [place] (336) 350-1642, phone; (336) 294-5005, fax Sees patients 1st and 3rd Saturday of every month.  Must not qualify for public or private insurance (i.e. Medicaid, Medicare, [place] Health Choice, Veterans' Benefits) • Household income should be no more than 200% of the poverty level •The clinic cannot treat you if you are pregnant or think you are pregnant • Sexually transmitted diseases are not treated at the clinic.  ° ° °Dental Care: °Organization         Address  Phone  Notes  °[place] [facility] [address],  (336) 641-6152 Accepts children up to age 21 who are enrolled in Medicaid or [place] Health Choice; pregnant women with a Medicaid card; and children who have applied for Medicaid or [place] Health Choice, but were declined, whose parents can pay a reduced fee at time of service.  °[place]  [facility] [place]  [address], [place] (336) 641-7733 Accepts children up to age 21 who are enrolled in Medicaid or [place] Health Choice; pregnant women with a Medicaid card; and children who have applied for Medicaid or [place] Health Choice, but were declined, whose parents can pay a reduced fee at time of service.  °[facility] ° [address],  (336) 641-4533 Patients are seen by appointment only. Walk-ins are not accepted. [facility] will see patients 18 years of age and older. °Monday - Tuesday (8am-5pm) °Most Wednesdays (8:30-5pm) °$30 per visit, cash only  °[facility] ° [address], [place] (336) 641-4533 Patients are seen by appointment only. Walk-ins are not accepted. [facility] will see patients 18 years of age and older. °One   Wednesday Evening (Monthly: Volunteer Based).  $30 per visit, cash only  °[facility]  (919) 537-3737 for adults; Children under age 4, call Graduate Pediatric Dentistry at (919) [phone]. Children aged 4-14, please call (919) [phone] to request a pediatric application. ° Dental services are provided in all areas of dental care including fillings, crowns and bridges, complete and partial dentures, implants, gum treatment, root canals, and extractions. Preventive care is also provided. Treatment is provided to both adults and children. °Patients are selected via a lottery and there is often a waiting list. °  °[facility] [address], °[place] ° (336) 763-8833 [email or web address] °  °[facility] [address], [place], [place] (336)723-1848, Ext. 123 Second and Fourth Thursday of each month, opens at 6:30 AM; Clinic ends at 9 AM.  Patients are seen on a first-come first-served basis, and a limited number are seen during each clinic.  ° °[facility] ° [address], [place], [place] (336) 723-7904    Eligibility Requirements °You must have lived in [place], [place], or [place] counties for at least the last three months. °  You cannot be eligible for state or federal sponsored healthcare insurance, including Veterans Administration, Medicaid, or Medicare. °  You generally cannot be eligible for healthcare insurance through your employer.  °  How to apply: °Eligibility screenings are held every Tuesday and Wednesday afternoon from 1:00 pm until 4:00 pm. You do not need an appointment for the interview!  °[facility] [address], [place], [place] 336-631-2330   °[facility]  336-342-8273   °[place]  336-703-3100   °[facility]  336-570-6415   ° °Behavioral Health Resources in the Community: °Intensive Outpatient Programs °Organization         Address  Phone  Notes  °[facility] [address], [place], [place] 336-878-6098   °[facility] [address], [place], [place] 336-832-9800   °[facility] [address], [place], [place] ° 336-882-2125   °[facility] [address],  °[place], [place] 1-800-853-5163 or 336-641-4981   °Substance Abuse Resources °Organization         Address  Phone  Notes  °[facility]  336-882-2125   °[facility]  336-784-9470   °[facility]  336-285-9073   °[facility]  336-845-3988   °Residential & Outpatient Substance Abuse Program  1-800-659-3381   °Psychological Services °Organization         Address  Phone  Notes  °[facility]  336- 832-9600   °[facility]  336- 378-7881   °[facility] [address], [place] 1-800-853-5163 or 336-641-4981   ° °Mobile Crisis Teams °Organization         Address  Phone  Notes  °[facility], Mobile Crisis Care Unit  1-877-626-1772   °Assertive °[facility] ° [address]  [place], [place] 336-834-9664   °Sharon DeEsch [address] °[place] 336-554-5454   ° °Self-Help/Support Groups °Organization         Address  Phone             Notes  °[facility] [place] - variety of support groups  336- 373-1402 Call for more information  °Narcotics Anonymous (NA), [facility] [address], °[place]  2 meetings at this location  ° °  Residential Treatment Programs °Organization         Address  Phone  Notes  °[facility] [address],    °[place]  1-866-801-8205   °[facility] ° [address], [place], [place] 704-293-8524   °[facility] [address], [place] 336-845-3988 Admissions: 8am-3pm M-F  °[facility] [address]    °[place], [place] 336-841-1104   °[facility] [address], [place], [place] 336-379-7146   °[facility] [address]  °[place], [place] 336-285-9073   °[facility] Intensive Outpatient [address], [place], [place] 336-852-3033   °[facility] [address]  °[place], [place] 1-877-615-2722 or 336-784-9470   °[facility] [address] [place], [place] 336-227-7417 Accepts Medicaid  °[facility] [address]  ° [place] 1-800-659-3381 Substance Abuse/Addiction Treatment  ° °[place] Behavioral Health Resources °Organization         Address  Phone  Notes  °[facility]  (888) 581-9988   °Massimo Hartland Brannon, PhD [address] [place], [place]   (336) 349-5553 or (336) 951-0000   °[facility]   [address] °[place], [place] (336) 349-4454   °[facility] [address], [place], [place] (336) 342-8316 Insurance/Medicaid/sponsorship through [facility]  °[facility] [address]                                    [place], [place] (336) 342-8316 Therapy/tele-psych/case    °[facility] [address]  ° [place], [place] (336) 349-2233    °Dr. Arfeen  (336) 349-4544   °[facility] [place]  [employer] [place] [facility] 1) [address], [place] °2) [address], [place] °3)  [address], [place] (336) 349-3220 °(336) 342-7768 ° °(336) 342-8140   °[place] [facility] (336) 342-1394 or (336) 342-3537 (After Hours)    ° ° °

## 2013-12-04 NOTE — ED Notes (Signed)
C/o L sided dental pain x 6 weeks.

## 2013-12-04 NOTE — ED Provider Notes (Signed)
CSN: [id number]     Arrival date & time 12/04/13  0143 History   First MD Initiated Contact with Patient 12/04/13 0445     Chief Complaint  Patient presents with  . Dental Pain     (Consider location/radiation/quality/duration/timing/severity/associated sxs/prior Treatment) HPI  This patient is a generally healthy young woman who presents with complaints of pain in the left maxillary region for the past 3-4 weeks. She thinks that her symptoms are secondary to a toothache. However, she has not appreciated any cold sensitivity, pain with eating food, heat sensitivity. No facial swelling or fever.  Her pain localizes to the left maxillary region. The patient has had some mild nasal congestion. Pain is aching, 8/10, worse when the patient bends over to pick something up.    Past Medical History  Diagnosis Date  . Trichomonas   . Chlamydia   . Herpes     Type 1, lesion X 1   Past Surgical History  Procedure Laterality Date  . Umbilical hernia    . Cyst removed from r ear    . Hernia repair    . Cesarean section     Family History  Problem Relation Age of Onset  . Anesthesia problems Neg Hx    History  Substance Use Topics  . Smoking status: Current Every Day Smoker -- 1.00 packs/day    Types: Cigarettes  . Smokeless tobacco: Never Used  . Alcohol Use: Yes     Comment: Occas. once per month   OB History   Grav Para Term Preterm Abortions TAB SAB Ect Mult Living   1 1 1  0 0 0 0 0 0 1     Review of Systems   Ten point review of symptoms performed and is negative with the exception of symptoms noted above.   Allergies  Codeine  Home Medications   Current Outpatient Rx  Name  Route  Sig  Dispense  Refill  . acetaminophen (TYLENOL) 500 MG tablet   Oral   Take 1,000 mg by mouth daily as needed for mild pain.         Marland Kitchen ibuprofen (ADVIL,MOTRIN) 200 MG tablet   Oral   Take 400-600 mg by mouth daily as needed for mild pain.         . Probiotic Product  (PROBIOTIC DAILY PO)   Oral   Take 1 tablet by mouth daily.         Marland Kitchen amoxicillin (AMOXIL) 500 MG capsule   Oral   Take 1 capsule (500 mg total) by mouth 3 (three) times daily.   30 capsule   0   . pseudoephedrine (SUDAFED) 30 MG/5ML syrup   Oral   Take 10 mLs (60 mg total) by mouth every 6 (six) hours as needed for congestion.   118 mL   0   . traMADol (ULTRAM) 50 MG tablet   Oral   Take 1 tablet (50 mg total) by mouth every 6 (six) hours as needed.   20 tablet   0    BP 125/87  Pulse 62  Temp(Src) 98.3 F (36.8 C) (Oral)  Resp 17  Wt 166 lb 2 oz (75.354 kg)  SpO2 100% Physical Exam Gen: well developed and well nourished appearing Head: NCAT Eyes: PERL, EOMI Nose: no epistaixis or rhinorrhea Face: ttp over the left maxillary sinus Mouth/throat: mucosa is moist and pink, there is some decay of tooth #24 but no tenderness or pain on percussion of any of the teeth,  Neck: supple, no stridor Lungs: CTA B, no wheezing, rhonchi or rales CV: regular rate and rythm, good distal pulses.  Abd: soft, notender, nondistended Back: no ttp, no cva ttp Skin: warm and dry Ext: no edema, normal to inspection Neuro: CN ii-xii grossly intact, no focal deficits Psyche; normal affect,  calm and cooperative.  ED Course  Procedures (including critical care time)  MDM   Patient with left maxillary sinusitis. We will tx with Amoxicillin (patient is uninsured), antihistamine and analgesia.  Referred to [facility] and given local dental resource information.     [name], MD 12/04/13 [phone]

## 2013-12-04 NOTE — ED Notes (Signed)
Pt came to the ED today because the Tylenol she has been taking at home is no longer working for the pain.  Pain in the mouth has been ongoing for approximately 6 weeks.  Pt states she has not seen a dentist in several years.

## 2014-03-18 ENCOUNTER — Emergency Department (HOSPITAL_COMMUNITY)
Admission: EM | Admit: 2014-03-18 | Discharge: 2014-03-18 | Disposition: A | Discharge status: Home / Self Care | Payer: BC Managed Care – PPO | Attending: Emergency Medicine | Admitting: Emergency Medicine

## 2014-03-18 ENCOUNTER — Encounter (HOSPITAL_COMMUNITY): Payer: Self-pay | Admitting: Emergency Medicine

## 2014-03-18 DIAGNOSIS — L0291 Cutaneous abscess, unspecified: Secondary | ICD-10-CM

## 2014-03-18 DIAGNOSIS — Z792 Long term (current) use of antibiotics: Secondary | ICD-10-CM | POA: Insufficient documentation

## 2014-03-18 DIAGNOSIS — L03319 Cellulitis of trunk, unspecified: Principal | ICD-10-CM

## 2014-03-18 DIAGNOSIS — L02219 Cutaneous abscess of trunk, unspecified: Secondary | ICD-10-CM | POA: Insufficient documentation

## 2014-03-18 DIAGNOSIS — Z8619 Personal history of other infectious and parasitic diseases: Secondary | ICD-10-CM | POA: Insufficient documentation

## 2014-03-18 DIAGNOSIS — Z79899 Other long term (current) drug therapy: Secondary | ICD-10-CM | POA: Insufficient documentation

## 2014-03-18 DIAGNOSIS — R6883 Chills (without fever): Secondary | ICD-10-CM | POA: Insufficient documentation

## 2014-03-18 DIAGNOSIS — F17200 Nicotine dependence, unspecified, uncomplicated: Secondary | ICD-10-CM | POA: Insufficient documentation

## 2014-03-18 NOTE — Discharge Instructions (Signed)

## 2014-03-18 NOTE — ED Notes (Signed)
Pt reports abscess to right side of groin x 2-3 weeks. No drainage. Painful to touch. No fever.

## 2014-03-18 NOTE — ED Provider Notes (Signed)
CSN: [id number]     Arrival date & time 03/18/14  1550 History  This chart was scribed for Katelyn Burke, PA-C working with Dot Lanes, MD by Randa Evens, ED Scribe. This patient was seen in room TR08C/TR08C and the patient's care was started at 4:16 PM.    Chief Complaint  Patient presents with  . Abscess   Patient is a 28 y.o. female presenting with abscess. The history is provided by the patient. No language interpreter was used.  Abscess Associated symptoms: no fever, no nausea and no vomiting    HPI Comments: Katelyn Shaw is a 28 y.o. female who presents to the Emergency Department complaining of constant throbbing abscess onset 3 week prior to arrival. She states the abscess is located on her right side groin area. She states she has some associated drainage from the abscess yesterday and chills. She states she believes she had an ingrown hair that became infected. She denies fever, nausea, vomiting or other related symptoms. She has never had an abscess in the past.  Past Medical History  Diagnosis Date  . Trichomonas   . Chlamydia   . Herpes     Type 1, lesion X 1   Past Surgical History  Procedure Laterality Date  . Umbilical hernia    . Cyst removed from r ear    . Hernia repair    . Cesarean section     Family History  Problem Relation Age of Onset  . Anesthesia problems Neg Hx    History  Substance Use Topics  . Smoking status: Current Every Day Smoker -- 1.00 packs/day    Types: Cigarettes  . Smokeless tobacco: Never Used  . Alcohol Use: Yes     Comment: Occas. once per month   OB History   Grav Para Term Preterm Abortions TAB SAB Ect Mult Living   1 1 1  0 0 0 0 0 0 1     Review of Systems  Constitutional: Positive for chills. Negative for fever.  Gastrointestinal: Negative for nausea and vomiting.  Skin: Positive for color change.  All other systems reviewed and are negative.   Allergies  Codeine  Home Medications   Prior to Admission  medications   Medication Sig Start Date End Date Taking? Authorizing Provider  acetaminophen (TYLENOL) 500 MG tablet Take 1,000 mg by mouth daily as needed for mild pain.    Historical Provider, MD  amoxicillin (AMOXIL) 500 MG capsule Take 1 capsule (500 mg total) by mouth 3 (three) times daily. 12/04/13   [name], MD  ibuprofen (ADVIL,MOTRIN) 200 MG tablet Take 400-600 mg by mouth daily as needed for mild pain.    Historical Provider, MD  Probiotic Product (PROBIOTIC DAILY PO) Take 1 tablet by mouth daily.    Historical Provider, MD  pseudoephedrine (SUDAFED) 30 MG/5ML syrup Take 10 mLs (60 mg total) by mouth every 6 (six) hours as needed for congestion. 12/04/13   [name], MD  traMADol (ULTRAM) 50 MG tablet Take 1 tablet (50 mg total) by mouth every 6 (six) hours as needed. 12/04/13   [name], MD   Triage Vitals: BP 124/77  Pulse 88  Temp(Src) 98.8 F (37.1 C) (Oral)  Resp 18  SpO2 98%  LMP 03/09/2014  Physical Exam  Nursing note and vitals reviewed. Constitutional: She is oriented to person, place, and time. She appears well-developed and well-nourished. No distress.  HENT:  Head: Normocephalic and atraumatic.  Right Ear: External ear normal.  Left Ear: External ear normal.  Nose: Nose normal.  Mouth/Throat: Oropharynx is clear and moist.  Eyes: Conjunctivae are normal.  Neck: Normal range of motion.  Cardiovascular: Normal rate, regular rhythm and normal heart sounds.   Pulmonary/Chest: Effort normal and breath sounds normal. No stridor. No respiratory distress. She has no wheezes. She has no rales.  Abdominal: Soft. She exhibits no distension.  Musculoskeletal: Normal range of motion.  Neurological: She is alert and oriented to person, place, and time. She has normal strength.  Skin: Skin is warm and dry. She is not diaphoretic. There is erythema.  2cm area of erythema and fluctuance in right inguinal crease.   Psychiatric: She has a normal mood and affect. Her  behavior is normal.    ED Course  Procedures (including critical care time) DIAGNOSTIC STUDIES: Oxygen Saturation is 98% on RA, normal by my interpretation.    COORDINATION OF CARE:    Labs Review Labs Reviewed - No data to display  Imaging Review No results found.   EKG Interpretation None      INCISION AND DRAINAGE Performed by: Katelyn Shaw Consent: Verbal consent obtained. Risks and benefits: risks, benefits and alternatives were discussed Type: abscess  Body area: right groin  Anesthesia: local infiltration  Incision was made with a scalpel.  Local anesthetic: lidocaine 2%   Anesthetic total: 1 ml  Complexity: complex Blunt dissection to break up loculations  Drainage: purulent  Drainage amount: copious  Patient tolerance: Patient tolerated the procedure well with no immediate complications.     MDM   Final diagnoses:  Abscess    Patient with skin abscess amenable to incision and drainage.  Abscess was not large enough to warrant packing or drain,  wound recheck in 2 days. Encouraged home warm soaks and flushing.  Mild signs of cellulitis is surrounding skin.  Will d/c to home.  No antibiotic therapy is indicated.   I personally performed the services described in this documentation, which was scribed in my presence. The recorded information has been reviewed and is accurate.    [name], PA-C 03/19/14 1347

## 2014-03-20 NOTE — ED Provider Notes (Signed)
Medical screening examination/treatment/procedure(s) were performed by non-physician practitioner and as supervising physician I was immediately available for consultation/collaboration.    Dot Lanes, MD 03/20/14 [phone]

## 2014-05-05 ENCOUNTER — Encounter (HOSPITAL_COMMUNITY): Payer: Self-pay | Admitting: *Deleted

## 2014-05-05 ENCOUNTER — Inpatient Hospital Stay (HOSPITAL_COMMUNITY)
Admission: AD | Admit: 2014-05-05 | Discharge: 2014-05-05 | Disposition: A | Discharge status: Home / Self Care | Payer: BC Managed Care – PPO | Source: Ambulatory Visit | Attending: Obstetrics & Gynecology | Admitting: Obstetrics & Gynecology

## 2014-05-05 DIAGNOSIS — B9689 Other specified bacterial agents as the cause of diseases classified elsewhere: Secondary | ICD-10-CM | POA: Diagnosis not present

## 2014-05-05 DIAGNOSIS — A499 Bacterial infection, unspecified: Secondary | ICD-10-CM | POA: Diagnosis not present

## 2014-05-05 DIAGNOSIS — R109 Unspecified abdominal pain: Secondary | ICD-10-CM | POA: Insufficient documentation

## 2014-05-05 DIAGNOSIS — N760 Acute vaginitis: Secondary | ICD-10-CM | POA: Insufficient documentation

## 2014-05-05 DIAGNOSIS — A599 Trichomoniasis, unspecified: Secondary | ICD-10-CM

## 2014-05-05 DIAGNOSIS — A5901 Trichomonal vulvovaginitis: Secondary | ICD-10-CM | POA: Diagnosis not present

## 2014-05-05 DIAGNOSIS — N898 Other specified noninflammatory disorders of vagina: Secondary | ICD-10-CM | POA: Diagnosis present

## 2014-05-05 HISTORY — DX: Other specified bacterial agents as the cause of diseases classified elsewhere: B96.89

## 2014-05-05 HISTORY — DX: Anemia, unspecified: D64.9

## 2014-05-05 HISTORY — DX: Acute vaginitis: N76.0

## 2014-05-05 LAB — URINE MICROSCOPIC-ADD ON

## 2014-05-05 LAB — URINALYSIS, ROUTINE W REFLEX MICROSCOPIC
Bilirubin Urine: NEGATIVE
Glucose, UA: NEGATIVE mg/dL
Ketones, ur: NEGATIVE mg/dL
NITRITE: NEGATIVE
PH: 6.5 (ref 5.0–8.0)
Protein, ur: NEGATIVE mg/dL
SPECIFIC GRAVITY, URINE: 1.01 (ref 1.005–1.030)
UROBILINOGEN UA: 0.2 mg/dL (ref 0.0–1.0)

## 2014-05-05 LAB — WET PREP, GENITAL: Yeast Wet Prep HPF POC: NONE SEEN

## 2014-05-05 MED ORDER — METRONIDAZOLE 500 MG PO TABS
2000.0000 mg | ORAL_TABLET | Freq: Once | ORAL | Status: AC
  Administered 2014-05-05: 2000 mg via ORAL
  Filled 2014-05-05: qty 4

## 2014-05-05 NOTE — MAU Provider Note (Addendum)
  History     CSN: [id number] Arrival date and time: 05/05/14 1937  First Provider Initiated Contact with Patient 05/05/14 0308     Chief Complaint  Patient presents with  . Vaginal Discharge  . Abdominal Pain   HPI 28 yo female, comes to ER for vaginal discharge and odor and cramping. Mirena IUD was placed last week. Patient was treated for BV 2 wks before that and feels her symptoms have returned. She called office but the appointments offered didn't work for her so she presented to ED tonight.  She has a new partner since Aug'15  Past Medical History  Diagnosis Date  . Trichomonas   . Chlamydia   . Herpes     Type 1, lesion X 1  . Bacterial vaginosis     recurring  . Anemia     Past Surgical History  Procedure Laterality Date  . Umbilical hernia    . Cyst removed from r ear    . Hernia repair    . Cesarean section      Family History  Problem Relation Age of Onset  . Anesthesia problems Neg Hx     History  Substance Use Topics  . Smoking status: Current Every Day Smoker -- 5.00 packs/day for 6 years    Types: Cigarettes  . Smokeless tobacco: Never Used  . Alcohol Use: Yes     Comment: Occas. once per month   Allergies:  Allergies  Allergen Reactions  . Codeine Hives and Swelling    Prescriptions prior to admission  Medication Sig Dispense Refill  . FOLIC ACID PO Take 1 tablet by mouth daily.       ROS no fever/ chills/ dysuria Physical Exam   Blood pressure 124/70, pulse 85, temperature 98 F (36.7 C), temperature source Oral, resp. rate 18, height 5\' 6"  (1.676 m), weight 161 lb (73.029 kg), last menstrual period 04/25/2014, SpO2 100.00%.  Physical Exam  A&O x 3, no acute distress. Pleasant Pelvic-  IUD string seen. Bloody dc noted. Uterus is non tender, no CMT or adnexal tenderness or masses noted.   MAU Course  Procedures UA negative Wet prep and G/C sent.   Assessment and Plan  Wet prep- Trichomonas, slight BV as well.  Rx Flagyl 2 gm  x1  Darwin Guastella R 05/05/2014, 4:26 AM

## 2014-05-05 NOTE — Discharge Instructions (Signed)

## 2014-05-05 NOTE — MAU Note (Signed)
Pt reports she had BV 2 weeks ago and took the meds but she thinks the infection has returned.

## 2014-05-06 LAB — GC/CHLAMYDIA PROBE AMP
CT PROBE, AMP APTIMA: NEGATIVE
GC PROBE AMP APTIMA: NEGATIVE

## 2014-05-16 ENCOUNTER — Inpatient Hospital Stay (HOSPITAL_COMMUNITY)
Admission: AD | Admit: 2014-05-16 | Discharge: 2014-05-16 | Disposition: A | Discharge status: Home / Self Care | Payer: BC Managed Care – PPO | Source: Ambulatory Visit | Attending: Obstetrics & Gynecology | Admitting: Obstetrics & Gynecology

## 2014-05-16 ENCOUNTER — Encounter (HOSPITAL_COMMUNITY): Payer: Self-pay | Admitting: *Deleted

## 2014-05-16 DIAGNOSIS — F17200 Nicotine dependence, unspecified, uncomplicated: Secondary | ICD-10-CM | POA: Diagnosis not present

## 2014-05-16 DIAGNOSIS — R109 Unspecified abdominal pain: Secondary | ICD-10-CM | POA: Insufficient documentation

## 2014-05-16 DIAGNOSIS — B9689 Other specified bacterial agents as the cause of diseases classified elsewhere: Secondary | ICD-10-CM | POA: Diagnosis not present

## 2014-05-16 DIAGNOSIS — A499 Bacterial infection, unspecified: Secondary | ICD-10-CM | POA: Diagnosis not present

## 2014-05-16 DIAGNOSIS — N760 Acute vaginitis: Secondary | ICD-10-CM | POA: Insufficient documentation

## 2014-05-16 LAB — URINALYSIS, ROUTINE W REFLEX MICROSCOPIC
Bilirubin Urine: NEGATIVE
Glucose, UA: NEGATIVE mg/dL
KETONES UR: 15 mg/dL — AB
Leukocytes, UA: NEGATIVE
NITRITE: NEGATIVE
PH: 6 (ref 5.0–8.0)
PROTEIN: NEGATIVE mg/dL
Specific Gravity, Urine: 1.02 (ref 1.005–1.030)
UROBILINOGEN UA: 1 mg/dL (ref 0.0–1.0)

## 2014-05-16 LAB — WET PREP, GENITAL
TRICH WET PREP: NONE SEEN
YEAST WET PREP: NONE SEEN

## 2014-05-16 LAB — URINE MICROSCOPIC-ADD ON

## 2014-05-16 LAB — POCT PREGNANCY, URINE: Preg Test, Ur: NEGATIVE

## 2014-05-16 MED ORDER — TINIDAZOLE 500 MG PO TABS: 2.0000 g | ORAL_TABLET | Freq: Every day | ORAL | Status: AC

## 2014-05-16 NOTE — MAU Provider Note (Signed)
History     CSN: [id number] Arrival date and time: 05/16/14 0847 Provider notified: 0908 Provider on unit: [id number] Provider at bedside: 1025   Chief Complaint  Patient presents with  . Abdominal Pain   Abdominal Pain   28 yo female, comes to ER for vaginal discharge and odor and cramping. Mirena IUD was placed last month. Patient was treated for trichomonas and BV 2 wks ago with Flagyl (which she vomited up) and Tindamax this past 8/31.  She feels her symptoms have not been adequately treated. She did not call office today, but she presented to ED today. She has a new partner since Aug'15, but has not had SI with him since her dx of trichomonas.  Past Medical History  Diagnosis Date  . Trichomonas   . Chlamydia   . Herpes     Type 1, lesion X 1  . Bacterial vaginosis     recurring  . Anemia     Past Surgical History  Procedure Laterality Date  . Umbilical hernia    . Cyst removed from r ear    . Hernia repair    . Cesarean section      Family History  Problem Relation Age of Onset  . Anesthesia problems Neg Hx     History  Substance Use Topics  . Smoking status: Current Every Day Smoker -- 5.00 packs/day for 6 years    Types: Cigarettes  . Smokeless tobacco: Never Used  . Alcohol Use: Yes     Comment: Occas. once per month   Allergies:  Allergies  Allergen Reactions  . Codeine Hives and Swelling    Prescriptions prior to admission  Medication Sig Dispense Refill  . tinidazole (TINDAMAX) 500 MG tablet Take 2,000 mg by mouth once.       Review of Systems  Constitutional: Negative.   HENT: Negative.   Eyes: Negative.   Respiratory: Negative.   Cardiovascular: Negative.   Gastrointestinal: Positive for abdominal pain.  Genitourinary:       Vaginal d/c  Musculoskeletal: Negative.   Skin: Negative.   Neurological: Negative.   Endo/Heme/Allergies: Negative.   Psychiatric/Behavioral: Negative.    no fever/ chills/ dysuria Results for orders placed during  the hospital encounter of 05/16/14 (from the past 24 hour(s))  URINALYSIS, ROUTINE W REFLEX MICROSCOPIC     Status: Abnormal   Collection Time    05/16/14  8:47 AM      Result Value Ref Range   Color, Urine YELLOW  YELLOW   APPearance CLEAR  CLEAR   Specific Gravity, Urine 1.020  1.005 - 1.030   pH 6.0  5.0 - 8.0   Glucose, UA NEGATIVE  NEGATIVE mg/dL   Hgb urine dipstick TRACE (*) NEGATIVE   Bilirubin Urine NEGATIVE  NEGATIVE   Ketones, ur 15 (*) NEGATIVE mg/dL   Protein, ur NEGATIVE  NEGATIVE mg/dL   Urobilinogen, UA 1.0  0.0 - 1.0 mg/dL   Nitrite NEGATIVE  NEGATIVE   Leukocytes, UA NEGATIVE  NEGATIVE  URINE MICROSCOPIC-ADD ON     Status: Abnormal   Collection Time    05/16/14  8:47 AM      Result Value Ref Range   Squamous Epithelial / LPF FEW (*) RARE   WBC, UA 0-2  <3 WBC/hpf   RBC / HPF 0-2  <3 RBC/hpf   Bacteria, UA FEW (*) RARE   Urine-Other MUCOUS PRESENT    POCT PREGNANCY, URINE     Status: None   Collection Time  05/16/14  9:09 AM      Result Value Ref Range   Preg Test, Ur NEGATIVE  NEGATIVE  WET PREP, GENITAL     Status: Abnormal   Collection Time    05/16/14 10:37 AM      Result Value Ref Range   Yeast Wet Prep HPF POC NONE SEEN  NONE SEEN   Trich, Wet Prep NONE SEEN  NONE SEEN   Clue Cells Wet Prep HPF POC FEW (*) NONE SEEN   WBC, Wet Prep HPF POC FEW (*) NONE SEEN   Physical Exam   Blood pressure 124/63, pulse 81, temperature 97.9 F (36.6 C), temperature source Oral, resp. rate 18, height 5\' 6"  (1.676 m), weight 72.666 kg (160 lb 3.2 oz), last menstrual period 04/25/2014.  Physical Exam  Constitutional: She is oriented to person, place, and time. She appears well-developed and well-nourished.  HENT:  Head: Normocephalic and atraumatic.  Eyes: Pupils are equal, round, and reactive to light.  Neck: Normal range of motion. Neck supple.  Cardiovascular: Normal rate, regular rhythm and normal heart sounds.   Respiratory: Effort normal and breath  sounds normal.  GI: Soft. Bowel sounds are normal.  Genitourinary: Vaginal discharge found.  IUD strings seen   Musculoskeletal: Normal range of motion.  Neurological: She is alert and oriented to person, place, and time.  Skin: Skin is warm and dry.  Psychiatric: She has a normal mood and affect. Her behavior is normal. Judgment and thought content normal.      MAU Course  Procedures CCUA UPT Negative Wet prep sent.  UCx sent Assessment and Plan  Wet prep- BV  Rx Tindamax 2 g x 2 days  Discharge home Keep scheduled appointment 9/17 Tub soaks with baking soda BID x 7 days during treatment Use OTC Luvena or Rephresh vaginal gel as directed on box prn after menses and SI Call the office for any further questions and/or concerns  Graceann Congress MSN, CNM 05/16/2014, 11:16 AM

## 2014-05-16 NOTE — Discharge Instructions (Signed)
Please call the office [phone] every time you have a question or concern; this includes after hours, weekends and holidays.  Bacterial Vaginosis Bacterial vaginosis is a vaginal infection that occurs when the normal balance of bacteria in the vagina is disrupted. It results from an overgrowth of certain bacteria. This is the most common vaginal infection in women of childbearing age. Treatment is important to prevent complications, especially in pregnant women, as it can cause a premature delivery. CAUSES  Bacterial vaginosis is caused by an increase in harmful bacteria that are normally present in smaller amounts in the vagina. Several different kinds of bacteria can cause bacterial vaginosis. However, the reason that the condition develops is not fully understood. RISK FACTORS Certain activities or behaviors can put you at an increased risk of developing bacterial vaginosis, including:  Having a new sex partner or multiple sex partners.  Douching.  Using an intrauterine device (IUD) for contraception. Women do not get bacterial vaginosis from toilet seats, bedding, swimming pools, or contact with objects around them. SIGNS AND SYMPTOMS  Some women with bacterial vaginosis have no signs or symptoms. Common symptoms include:  Grey vaginal discharge.  A fishlike odor with discharge, especially after sexual intercourse.  Itching or burning of the vagina and vulva.  Burning or pain with urination. DIAGNOSIS  Your health care provider will take a medical history and examine the vagina for signs of bacterial vaginosis. A sample of vaginal fluid may be taken. Your health care provider will look at this sample under a microscope to check for bacteria and abnormal cells. A vaginal pH test may also be done.  TREATMENT  Bacterial vaginosis may be treated with antibiotic medicines. These may be given in the form of a pill or a vaginal cream. A second round of antibiotics may be prescribed if  the condition comes back after treatment.  HOME CARE INSTRUCTIONS   Only take over-the-counter or prescription medicines as directed by your health care provider.  If antibiotic medicine was prescribed, take it as directed. Make sure you finish it even if you start to feel better.  Do not have sex until treatment is completed.  Tell all sexual partners that you have a vaginal infection. They should see their health care provider and be treated if they have problems, such as a mild rash or itching.  Practice safe sex by using condoms and only having one sex partner.  During Treatment: - Fill a bath tub with enough water to have vaginal area completely submerged  - Put about 1 cup of baking soda in the water, mix well - Soak in the water for about 20 minutes twice a day for 1 week - Purchase Luvena or Rephresh vaginal gel at the local pharmacy; use as directed - Okay to repeat regimen after menstrual cycle and sexual intercourse or as needed  [place] IF:   Your symptoms are not improving after 3 days of treatment.  You have increased discharge or pain.  You have a fever. MAKE SURE YOU:   Understand these instructions.  Will watch your condition.  Will get help right away if you are not doing well or get worse. FOR MORE INFORMATION  Centers for Disease Control and Prevention, Division of STD Prevention: AppraiserFraud.fi American Sexual Health Association (ASHA): www.ashastd.org  Document Released: 08/28/2005 Document Revised: 06/18/2013 Document Reviewed: 04/09/2013 [facility] Patient Information 2015 [place], [place]. This information is not intended to replace advice given to you by your health care provider. Make sure  you discuss any questions you have with your health care provider. ° °

## 2014-05-16 NOTE — MAU Note (Signed)
Sharp, heavy feeling in lower abdomen.  Some vaginal bleeding, just got Mirena a couple weeks ago.  Some offwhite discharge.

## 2014-05-17 LAB — CULTURE, OB URINE
Colony Count: NO GROWTH
Culture: NO GROWTH
Special Requests: NORMAL

## 2014-07-13 ENCOUNTER — Encounter (HOSPITAL_COMMUNITY): Payer: Self-pay | Admitting: *Deleted

## 2014-09-16 ENCOUNTER — Emergency Department (HOSPITAL_COMMUNITY)
Admission: EM | Admit: 2014-09-16 | Discharge: 2014-09-16 | Disposition: A | Discharge status: Home / Self Care | Payer: BLUE CROSS/BLUE SHIELD | Attending: Emergency Medicine | Admitting: Emergency Medicine

## 2014-09-16 ENCOUNTER — Encounter (HOSPITAL_COMMUNITY): Payer: Self-pay | Admitting: *Deleted

## 2014-09-16 DIAGNOSIS — Z862 Personal history of diseases of the blood and blood-forming organs and certain disorders involving the immune mechanism: Secondary | ICD-10-CM | POA: Insufficient documentation

## 2014-09-16 DIAGNOSIS — Z8619 Personal history of other infectious and parasitic diseases: Secondary | ICD-10-CM | POA: Diagnosis not present

## 2014-09-16 DIAGNOSIS — J020 Streptococcal pharyngitis: Secondary | ICD-10-CM | POA: Insufficient documentation

## 2014-09-16 DIAGNOSIS — R21 Rash and other nonspecific skin eruption: Secondary | ICD-10-CM | POA: Diagnosis not present

## 2014-09-16 DIAGNOSIS — Z720 Tobacco use: Secondary | ICD-10-CM | POA: Insufficient documentation

## 2014-09-16 DIAGNOSIS — J029 Acute pharyngitis, unspecified: Secondary | ICD-10-CM | POA: Diagnosis present

## 2014-09-16 DIAGNOSIS — Z8742 Personal history of other diseases of the female genital tract: Secondary | ICD-10-CM | POA: Insufficient documentation

## 2014-09-16 LAB — RAPID STREP SCREEN (MED CTR MEBANE ONLY): STREPTOCOCCUS, GROUP A SCREEN (DIRECT): POSITIVE — AB

## 2014-09-16 MED ORDER — TRIAMCINOLONE ACETONIDE 0.1 % EX CREA: 1.0000 "application " | TOPICAL_CREAM | Freq: Two times a day (BID) | CUTANEOUS | Status: DC

## 2014-09-16 MED ORDER — PENICILLIN G BENZATHINE 1200000 UNIT/2ML IM SUSP
1.2000 10*6.[IU] | Freq: Once | INTRAMUSCULAR | Status: AC
  Administered 2014-09-16: 1.2 10*6.[IU] via INTRAMUSCULAR
  Filled 2014-09-16: qty 2

## 2014-09-16 NOTE — ED Notes (Signed)
Refused wheelchair 

## 2014-09-16 NOTE — ED Notes (Signed)
Pt in c/o sore throat for the last two days, no distress noted, reports body aches but unsure of fever

## 2014-09-16 NOTE — ED Provider Notes (Signed)
CSN: [id number]     Arrival date & time 09/16/14  1659 History   First MD Initiated Contact with Patient 09/16/14 1718     Chief Complaint  Patient presents with  . Sore Throat     (Consider location/radiation/quality/duration/timing/severity/associated sxs/prior Treatment) HPI Comments: 29 year old female presenting with sore throat 2 days, worsening today. No aggravating or alleviating factors. Admits to associated generalized body aches and cough. Her appetite is decreased. She also noticed a rash on her abdomen beginning 2-3 days ago. Denies any new soaps, detergents, foods, medications, pets or contacts with similar rash. She is an EMT and always around sick people. Denies fever, chills or vomiting.  Patient is a 29 y.o. female presenting with pharyngitis. The history is provided by the patient.  Sore Throat Associated symptoms include coughing, a rash and a sore throat.    Past Medical History  Diagnosis Date  . Trichomonas   . Chlamydia   . Herpes     Type 1, lesion X 1  . Bacterial vaginosis     recurring  . Anemia    Past Surgical History  Procedure Laterality Date  . Umbilical hernia    . Cyst removed from r ear    . Hernia repair    . Cesarean section     Family History  Problem Relation Age of Onset  . Anesthesia problems Neg Hx    History  Substance Use Topics  . Smoking status: Current Every Day Smoker -- 5.00 packs/day for 6 years    Types: Cigarettes  . Smokeless tobacco: Never Used  . Alcohol Use: Yes     Comment: Occas. once per month   OB History    Gravida Para Term Preterm AB TAB SAB Ectopic Multiple Living   1 1 1  0 0 0 0 0 0 1     Review of Systems  Constitutional: Positive for appetite change.  HENT: Positive for sore throat.   Respiratory: Positive for cough.   Skin: Positive for rash.  All other systems reviewed and are negative.     Allergies  Codeine  Home Medications   Prior to Admission medications   Medication Sig Start  Date End Date Taking? Authorizing Provider  triamcinolone cream (KENALOG) 0.1 % Apply 1 application topically 2 (two) times daily. 09/16/14   Shown Dissinger M Sabino Denning, PA-C   BP 100/67 mmHg  Pulse 100  Temp(Src) 98.7 F (37.1 C) (Oral)  Resp 20  Ht 5\' 5"  (1.651 m)  Wt 173 lb (78.472 kg)  BMI 28.79 kg/m2  SpO2 100%  LMP 08/11/2014 Physical Exam  Constitutional: She is oriented to person, place, and time. She appears well-developed and well-nourished. No distress.  HENT:  Head: Normocephalic and atraumatic.  Post oropharyngeal erythema and edema without exudate.  Eyes: Conjunctivae and EOM are normal.  Neck: Normal range of motion. Neck supple.  Cardiovascular: Normal rate, regular rhythm and normal heart sounds.   Pulmonary/Chest: Effort normal and breath sounds normal. No respiratory distress.  Musculoskeletal: Normal range of motion. She exhibits no edema.  Lymphadenopathy:    She has cervical adenopathy.  Neurological: She is alert and oriented to person, place, and time. No sensory deficit.  Skin: Skin is warm and dry.  Maculopapular rash scattered across abdomen. No signs of secondary infection.  Psychiatric: She has a normal mood and affect. Her behavior is normal.  Nursing note and vitals reviewed.   ED Course  Procedures (including critical care time) Labs Review Labs Reviewed  RAPID  STREP SCREEN - Abnormal; Notable for the following:    Streptococcus, Group A Screen (Direct) POSITIVE (*)    All other components within normal limits    Imaging Review No results found.   EKG Interpretation None      MDM   Final diagnoses:  Strep pharyngitis  Rash   Patient in no apparent distress. Vital signs stable. No tachycardia on my exam. Swallow secretions well. Rapid strep positive. Bicillin IM given in the ED. Advised salt water gargles. Regarding rash, it has appearance of contact dermatitis. Will prescribe triamcinolone cream. Stable for discharge. Return precautions given.  Patient states understanding of treatment care plan and is agreeable.   Carman Ching, PA-C 09/16/14 1824  Carmin Muskrat, MD 09/17/14 [phone]

## 2014-09-16 NOTE — Discharge Instructions (Signed)
You were treated today for strep throat. Rest and stay well-hydrated. Use salt water gargles. Apply triamcinolone cream twice daily to your rash.  Strep Throat Strep throat is an infection of the throat caused by a bacteria named Streptococcus pyogenes. Your health care provider may call the infection streptococcal "tonsillitis" or "pharyngitis" depending on whether there are signs of inflammation in the tonsils or back of the throat. Strep throat is most common in children aged 5-15 years during the cold months of the year, but it can occur in people of any age during any season. This infection is spread from person to person (contagious) through coughing, sneezing, or other close contact. SIGNS AND SYMPTOMS   Fever or chills.  Painful, swollen, red tonsils or throat.  Pain or difficulty when swallowing.  White or yellow spots on the tonsils or throat.  Swollen, tender lymph nodes or "glands" of the neck or under the jaw.  Red rash all over the body (rare). DIAGNOSIS  Many different infections can cause the same symptoms. A test must be done to confirm the diagnosis so the right treatment can be given. A "rapid strep test" can help your health care provider make the diagnosis in a few minutes. If this test is not available, a light swab of the infected area can be used for a throat culture test. If a throat culture test is done, results are usually available in a day or two. TREATMENT  Strep throat is treated with antibiotic medicine. HOME CARE INSTRUCTIONS   Gargle with 1 tsp of salt in 1 cup of warm water, 3-4 times per day or as needed for comfort.  Family members who also have a sore throat or fever should be tested for strep throat and treated with antibiotics if they have the strep infection.  Make sure everyone in your household washes their hands well.  Do not share food, drinking cups, or personal items that could cause the infection to spread to others.  You may need to eat  a soft food diet until your sore throat gets better.  Drink enough water and fluids to keep your urine clear or pale yellow. This will help prevent dehydration.  Get plenty of rest.  Stay home from school, day care, or work until you have been on antibiotics for 24 hours.  Take medicines only as directed by your health care provider.  Take your antibiotic medicine as directed by your health care provider. Finish it even if you start to feel better. SEEK MEDICAL CARE IF:   The glands in your neck continue to enlarge.  You develop a rash, cough, or earache.  You cough up green, yellow-brown, or bloody sputum.  You have pain or discomfort not controlled by medicines.  Your problems seem to be getting worse rather than better.  You have a fever. SEEK IMMEDIATE MEDICAL CARE IF:   You develop any new symptoms such as vomiting, severe headache, stiff or painful neck, chest pain, shortness of breath, or trouble swallowing.  You develop severe throat pain, drooling, or changes in your voice.  You develop swelling of the neck, or the skin on the neck becomes red and tender.  You develop signs of dehydration, such as fatigue, dry mouth, and decreased urination.  You become increasingly sleepy, or you cannot wake up completely. MAKE SURE YOU:  Understand these instructions.  Will watch your condition.  Will get help right away if you are not doing well or get worse. Document Released: 08/25/2000  Document Revised: 01/12/2014 Document Reviewed: 10/27/2010 [facility] Patient Information 2015 [place], [place]. This information is not intended to replace advice given to you by your health care provider. Make sure you discuss any questions you have with your health care provider.  Rash A rash is a change in the color or texture of your skin. There are many different types of rashes. You may have other problems that accompany your rash. CAUSES   Infections.  Allergic reactions. This can  include allergies to pets or foods.  Certain medicines.  Exposure to certain chemicals, soaps, or cosmetics.  Heat.  Exposure to poisonous plants.  Tumors, both cancerous and noncancerous. SYMPTOMS   Redness.  Scaly skin.  Itchy skin.  Dry or cracked skin.  Bumps.  Blisters.  Pain. DIAGNOSIS  Your caregiver may do a physical exam to determine what type of rash you have. A skin sample (biopsy) may be taken and examined under a microscope. TREATMENT  Treatment depends on the type of rash you have. Your caregiver may prescribe certain medicines. For serious conditions, you may need to see a skin doctor (dermatologist). HOME CARE INSTRUCTIONS   Avoid the substance that caused your rash.  Do not scratch your rash. This can cause infection.  You may take cool baths to help stop itching.  Only take over-the-counter or prescription medicines as directed by your caregiver.  Keep all follow-up appointments as directed by your caregiver. SEEK IMMEDIATE MEDICAL CARE IF:  You have increasing pain, swelling, or redness.  You have a fever.  You have new or severe symptoms.  You have body aches, diarrhea, or vomiting.  Your rash is not better after 3 days. MAKE SURE YOU:  Understand these instructions.  Will watch your condition.  Will get help right away if you are not doing well or get worse. Document Released: 08/18/2002 Document Revised: 11/20/2011 Document Reviewed: 06/12/2011 [facility] Patient Information 2015 [place], [place]. This information is not intended to replace advice given to you by your health care provider. Make sure you discuss any questions you have with your health care provider.

## 2015-05-08 ENCOUNTER — Emergency Department (HOSPITAL_COMMUNITY)
Admission: EM | Admit: 2015-05-08 | Discharge: 2015-05-09 | Disposition: A | Discharge status: Home / Self Care | Payer: BLUE CROSS/BLUE SHIELD | Attending: Emergency Medicine | Admitting: Emergency Medicine

## 2015-05-08 ENCOUNTER — Encounter (HOSPITAL_COMMUNITY): Payer: Self-pay | Admitting: Emergency Medicine

## 2015-05-08 DIAGNOSIS — Z8619 Personal history of other infectious and parasitic diseases: Secondary | ICD-10-CM | POA: Insufficient documentation

## 2015-05-08 DIAGNOSIS — R509 Fever, unspecified: Secondary | ICD-10-CM | POA: Insufficient documentation

## 2015-05-08 DIAGNOSIS — M791 Myalgia, unspecified site: Secondary | ICD-10-CM

## 2015-05-08 DIAGNOSIS — Z3202 Encounter for pregnancy test, result negative: Secondary | ICD-10-CM | POA: Diagnosis not present

## 2015-05-08 DIAGNOSIS — Z8742 Personal history of other diseases of the female genital tract: Secondary | ICD-10-CM | POA: Insufficient documentation

## 2015-05-08 DIAGNOSIS — Z862 Personal history of diseases of the blood and blood-forming organs and certain disorders involving the immune mechanism: Secondary | ICD-10-CM | POA: Insufficient documentation

## 2015-05-08 DIAGNOSIS — Z720 Tobacco use: Secondary | ICD-10-CM | POA: Diagnosis not present

## 2015-05-08 DIAGNOSIS — R197 Diarrhea, unspecified: Secondary | ICD-10-CM | POA: Diagnosis not present

## 2015-05-08 LAB — COMPREHENSIVE METABOLIC PANEL
ALBUMIN: 4.2 g/dL (ref 3.5–5.0)
ALK PHOS: 40 U/L (ref 38–126)
ALT: 21 U/L (ref 14–54)
AST: 25 U/L (ref 15–41)
Anion gap: 7 (ref 5–15)
BILIRUBIN TOTAL: 0.6 mg/dL (ref 0.3–1.2)
CALCIUM: 9.2 mg/dL (ref 8.9–10.3)
CO2: 24 mmol/L (ref 22–32)
Chloride: 105 mmol/L (ref 101–111)
Creatinine, Ser: 0.84 mg/dL (ref 0.44–1.00)
GFR calc Af Amer: >60 mL/min (ref >60)
GFR calc non Af Amer: >60 mL/min (ref >60)
Glucose, Bld: 111 mg/dL — ABNORMAL HIGH (ref 65–99)
Potassium: 3.4 mmol/L — ABNORMAL LOW (ref 3.5–5.1)
Sodium: 136 mmol/L (ref 135–145)
TOTAL PROTEIN: 7.9 g/dL (ref 6.5–8.1)

## 2015-05-08 LAB — CBC WITH DIFFERENTIAL/PLATELET
BASOS ABS: 0 10*3/uL (ref 0.0–0.1)
BASOS PCT: 0 % (ref 0–1)
Eosinophils Absolute: 0 10*3/uL (ref 0.0–0.7)
Eosinophils Relative: 0 % (ref 0–5)
HEMATOCRIT: 39.1 % (ref 36.0–46.0)
HEMOGLOBIN: 12.8 g/dL (ref 12.0–15.0)
Lymphocytes Relative: 10 % — ABNORMAL LOW (ref 12–46)
Lymphs Abs: 1.2 10*3/uL (ref 0.7–4.0)
MCH: 30.4 pg (ref 26.0–34.0)
MCHC: 32.7 g/dL (ref 30.0–36.0)
MCV: 92.9 fL (ref 78.0–100.0)
Monocytes Absolute: 1.2 10*3/uL — ABNORMAL HIGH (ref 0.1–1.0)
Monocytes Relative: 10 % (ref 3–12)
NEUTROS ABS: 9.8 10*3/uL — AB (ref 1.7–7.7)
NEUTROS PCT: 80 % — AB (ref 43–77)
Platelets: 324 10*3/uL (ref 150–400)
RBC: 4.21 MIL/uL (ref 3.87–5.11)
RDW: 14.1 % (ref 11.5–15.5)
WBC: 12.2 10*3/uL — AB (ref 4.0–10.5)

## 2015-05-08 LAB — LIPASE, BLOOD: Lipase: 24 U/L (ref 22–51)

## 2015-05-08 LAB — I-STAT BETA HCG BLOOD, ED (MC, WL, AP ONLY): I-stat hCG, quantitative: <5 m[IU]/mL (ref <5)

## 2015-05-08 LAB — I-STAT CG4 LACTIC ACID, ED
Lactic Acid, Venous: 1.23 mmol/L (ref 0.5–2.0)
Lactic Acid, Venous: 0.39 mmol/L — ABNORMAL LOW (ref 0.5–2.0)

## 2015-05-08 MED ORDER — SODIUM CHLORIDE 0.9 % IV BOLUS (SEPSIS)
1000.0000 mL | Freq: Once | INTRAVENOUS | Status: AC
  Administered 2015-05-08: 1000 mL via INTRAVENOUS

## 2015-05-08 MED ORDER — KETOROLAC TROMETHAMINE 30 MG/ML IJ SOLN
30.0000 mg | Freq: Once | INTRAMUSCULAR | Status: AC
  Administered 2015-05-08: 30 mg via INTRAVENOUS
  Filled 2015-05-08: qty 1

## 2015-05-08 MED ORDER — ACETAMINOPHEN 325 MG PO TABS
650.0000 mg | ORAL_TABLET | Freq: Once | ORAL | Status: AC | PRN
  Administered 2015-05-08: 650 mg via ORAL
  Filled 2015-05-08: qty 2

## 2015-05-08 NOTE — ED Provider Notes (Signed)
CSN: [id number]     Arrival date & time 05/08/15  1748 History   First MD Initiated Contact with Patient 05/08/15 1756     Chief Complaint  Patient presents with  . Generalized Body Aches  . Fever  . Diarrhea     (Consider location/radiation/quality/duration/timing/severity/associated sxs/prior Treatment) HPI 29 year old female, otherwise healthy, who presents with fever, diarrhea, and generalized myalgias. Began to feel unwell yesterday morning, while at work. Reports feeling sore and stiff all over. Had multiple episodes of diarrhea all day with low grade fever. Today, continued to feel worse. 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 101.87F today, and took motrin for this. Denies cough, sob, chest pain, sore throat, Mild runny nose. Nausea but denies vomiting. Also complains of associated headache, but denies vision changes, speech changes, photophobia, numbness, weakness.   History of hernia repair but no other abdominal surgeries.     Past Medical History  Diagnosis Date  . Trichomonas   . Chlamydia   . Herpes     Type 1, lesion X 1  . Bacterial vaginosis     recurring  . Anemia    Past Surgical History  Procedure Laterality Date  . Umbilical hernia    . Cyst removed from r ear    . Hernia repair    . Cesarean section     Family History  Problem Relation Age of Onset  . Anesthesia problems Neg Hx    Social History  Substance Use Topics  . Smoking status: Current Every Day Smoker -- 5.00 packs/day for 6 years    Types: Cigarettes  . Smokeless tobacco: Never Used  . Alcohol Use: Yes     Comment: Occas. once per month   OB History    Gravida Para Term Preterm AB TAB SAB Ectopic Multiple Living   1 1 1  0 0 0 0 0 0 1     Review of Systems  10/14 systems reviewed and are negative other than those stated in the HPI   Allergies  Codeine  Home Medications   Prior to Admission medications   Medication Sig Start Date End Date Taking? Authorizing Provider   ibuprofen (ADVIL,MOTRIN) 200 MG tablet Take 400 mg by mouth every 6 (six) hours as needed for moderate pain.   Yes Historical Provider, MD  triamcinolone cream (KENALOG) 0.1 % Apply 1 application topically 2 (two) times daily. Patient not taking: Reported on 05/08/2015 09/16/14   Hessie Diener Hess, PA-C   BP 106/57 mmHg  Pulse 87  Temp(Src) 102.1 F (38.9 C) (Oral)  Resp 14  SpO2 99% Physical Exam Physical Exam  Nursing note and vitals reviewed. Constitutional: Well developed, well nourished, non-toxic, and in no acute distress Head: Normocephalic and atraumatic.  Mouth/Throat: Oropharynx is clear and moist.  Neck: Normal range of motion. Neck supple. No meningismus. Cardiovascular: Normal rate and regular rhythm.  No edema.  Pulmonary/Chest: Effort normal and breath sounds normal.  Abdominal: Soft. There is mild generalized tenderness. There is no rebound and no guarding. No CVA tenderness. Musculoskeletal: Normal range of motion.  Neurological: Alert, no facial droop, fluent speech, moves all extremities symmetrically Skin: Skin is warm and dry.  Psychiatric: Cooperative  ED Course  Procedures (including critical care time) Labs Review Labs Reviewed  CBC WITH DIFFERENTIAL/PLATELET - Abnormal; Notable for the following:    WBC 12.2 (*)    Neutrophils Relative % 80 (*)    Neutro Abs 9.8 (*)    Lymphocytes Relative  10 (*)    Monocytes Absolute 1.2 (*)    All other components within normal limits  COMPREHENSIVE METABOLIC PANEL - Abnormal; Notable for the following:    Potassium 3.4 (*)    Glucose, Bld 111 (*)    BUN <5 (*)    All other components within normal limits  I-STAT CG4 LACTIC ACID, ED - Abnormal; Notable for the following:    Lactic Acid, Venous 0.39 (*)    All other components within normal limits  LIPASE, BLOOD  URINALYSIS, ROUTINE W REFLEX MICROSCOPIC (NOT AT [facility])  I-STAT BETA HCG BLOOD, ED ([facility], [facility], [facility] ONLY)  I-STAT CG4 LACTIC ACID, ED    I have personally  reviewed and evaluated these images and lab results as part of my medical decision-making.   MDM   Final diagnoses:  Fever, unspecified fever cause  Myalgia  Diarrhea     29 year old female, otherwise healthy, who presents with fever, myalgias, diarrhea, and generalized malaise. She is febrile and tachycardic on presentation, but normotensive, without any respiratory issues. Overall clinical presentation seems consistent with likely flu-like illness. She on exam has a soft and non-surgical abdomen, with generalized tenderness. Remainder of her exam including cardiopulmonary exam is unremarkable.  Headache is mild in nature, and she has no meningismus, and there is low suspicion for meningitis. No other concern for serious bacterial illness at this time.  She has normal lactic acid, and unremarkable CBC, comprehensive metabolic profile, lipase, and pregnancy test. She is given IV fluids and anti-emetics.  Also given Tylenol and Toradol for fever and generalized myalgias.  She does report improved symptoms, and is able to take food and fluids here in the emergency Department by mouth without difficulty. She is felt appropriate for discharge home. Given instructions for supportive care at home. Strict return and follow-up instructions are reviewed. She is breast understanding of all discharge instructions for comfortable the plan of care.    Forde Dandy, MD 05/09/15 [phone]

## 2015-05-08 NOTE — ED Notes (Signed)
Notified nurse of lactic acid results

## 2015-05-08 NOTE — ED Notes (Signed)
Pt c/o fever, body aches, diarrhea that started yesterday morning. Pt states that she took ibuprofen around 11am today.

## 2015-05-08 NOTE — ED Notes (Signed)
Notified edp lactic acid results

## 2015-05-08 NOTE — ED Notes (Signed)
Pt is in bathroom, she accidentally "pooped" on herself, she is also obtaining a urine sample

## 2015-05-09 LAB — URINE MICROSCOPIC-ADD ON

## 2015-05-09 LAB — URINALYSIS, ROUTINE W REFLEX MICROSCOPIC
BILIRUBIN URINE: NEGATIVE
GLUCOSE, UA: NEGATIVE mg/dL
Ketones, ur: NEGATIVE mg/dL
Leukocytes, UA: NEGATIVE
Nitrite: NEGATIVE
PROTEIN: NEGATIVE mg/dL
Specific Gravity, Urine: 1.006 (ref 1.005–1.030)
UROBILINOGEN UA: 0.2 mg/dL (ref 0.0–1.0)
pH: 5.5 (ref 5.0–8.0)

## 2015-05-09 NOTE — Discharge Instructions (Signed)
Make sure you are drinking plenty of fluids and get plenty of rest. Return without fail for worsening symptoms, including worsening pain, vomiting and unable to keep down food/fluids, or any other symptoms concerning to you.    Diarrhea Diarrhea is watery poop (stool). It can make you feel weak, tired, thirsty, or give you a dry mouth (signs of dehydration). Watery poop is a sign of another problem, most often an infection. It often lasts 2-3 days. It can last longer if it is a sign of something serious. Take care of yourself as told by your doctor. HOME CARE   Drink 1 cup (8 ounces) of fluid each time you have watery poop.  Do not drink the following fluids:  Those that contain simple sugars (fructose, glucose, galactose, lactose, sucrose, maltose).  Sports drinks.  Fruit juices.  Whole milk products.  Sodas.  Drinks with caffeine (coffee, tea, soda) or alcohol.  Oral rehydration solution may be used if the doctor says it is okay. You may make your own solution. Follow this recipe:   - teaspoon table salt.   teaspoon baking soda.   teaspoon salt substitute containing potassium chloride.  1 tablespoons sugar.  1 liter (34 ounces) of water.  Avoid the following foods:  High fiber foods, such as raw fruits and vegetables.  Nuts, seeds, and whole grain breads and cereals.   Those that are sweetened with sugar alcohols (xylitol, sorbitol, mannitol).  Try eating the following foods:  Starchy foods, such as rice, toast, pasta, low-sugar cereal, oatmeal, baked potatoes, crackers, and bagels.  Bananas.  Applesauce.  Eat probiotic-rich foods, such as yogurt and milk products that are fermented.  Wash your hands well after each time you have watery poop.  Only take medicine as told by your doctor.  Take a warm bath to help lessen burning or pain from having watery poop. GET HELP RIGHT AWAY IF:   You cannot drink fluids without throwing up (vomiting).  You keep  throwing up.  You have blood in your poop, or your poop looks black and tarry.  You do not pee (urinate) in 6-8 hours, or there is only a small amount of very dark pee.  You have belly (abdominal) pain that gets worse or stays in the same spot (localizes).  You are weak, dizzy, confused, or light-headed.  You have a very bad headache.  Your watery poop gets worse or does not get better.  You have a fever or lasting symptoms for more than 2-3 days.  You have a fever and your symptoms suddenly get worse. MAKE SURE YOU:   Understand these instructions.  Will watch your condition.  Will get help right away if you are not doing well or get worse. Document Released: 02/14/2008 Document Revised: 01/12/2014 Document Reviewed: 05/05/2012 [facility] Patient Information 2015 [place], [place]. This information is not intended to replace advice given to you by your health care provider. Make sure you discuss any questions you have with your health care provider.

## 2016-04-10 ENCOUNTER — Encounter (HOSPITAL_COMMUNITY): Payer: Self-pay

## 2016-04-10 ENCOUNTER — Inpatient Hospital Stay (HOSPITAL_COMMUNITY)
Admission: AD | Admit: 2016-04-10 | Discharge: 2016-04-11 | Disposition: A | Discharge status: Home / Self Care | Payer: BLUE CROSS/BLUE SHIELD | Source: Ambulatory Visit | Attending: Obstetrics and Gynecology | Admitting: Obstetrics and Gynecology

## 2016-04-10 DIAGNOSIS — B373 Candidiasis of vulva and vagina: Secondary | ICD-10-CM | POA: Insufficient documentation

## 2016-04-10 DIAGNOSIS — N898 Other specified noninflammatory disorders of vagina: Secondary | ICD-10-CM | POA: Diagnosis present

## 2016-04-10 DIAGNOSIS — F17210 Nicotine dependence, cigarettes, uncomplicated: Secondary | ICD-10-CM | POA: Diagnosis not present

## 2016-04-10 DIAGNOSIS — B3731 Acute candidiasis of vulva and vagina: Secondary | ICD-10-CM

## 2016-04-10 NOTE — MAU Note (Signed)
Pt presents complaining of increased discharge and pain. States she had a yeast infection that she took diflucan for and 2 rounds of monistat but nothing helps. Last monistat on Friday.

## 2016-04-11 DIAGNOSIS — B373 Candidiasis of vulva and vagina: Secondary | ICD-10-CM | POA: Diagnosis not present

## 2016-04-11 LAB — URINALYSIS, ROUTINE W REFLEX MICROSCOPIC
Bilirubin Urine: NEGATIVE
Glucose, UA: NEGATIVE mg/dL
KETONES UR: NEGATIVE mg/dL
Nitrite: NEGATIVE
PH: 5.5 (ref 5.0–8.0)
PROTEIN: NEGATIVE mg/dL
Specific Gravity, Urine: 1.02 (ref 1.005–1.030)

## 2016-04-11 LAB — WET PREP, GENITAL
CLUE CELLS WET PREP: NONE SEEN
SPERM: NONE SEEN
TRICH WET PREP: NONE SEEN
Yeast Wet Prep HPF POC: NONE SEEN

## 2016-04-11 LAB — GC/CHLAMYDIA PROBE AMP (~~LOC~~) NOT AT ARMC
Chlamydia: NEGATIVE
Neisseria Gonorrhea: NEGATIVE

## 2016-04-11 LAB — URINE MICROSCOPIC-ADD ON

## 2016-04-11 MED ORDER — LACTINEX PO CHEW: 1.0000 | CHEWABLE_TABLET | Freq: Three times a day (TID) | ORAL | 1 refills | Status: DC

## 2016-04-11 MED ORDER — TERCONAZOLE 0.4 % VA CREA: 1.0000 | TOPICAL_CREAM | Freq: Every day | VAGINAL | 0 refills | Status: DC

## 2016-04-11 NOTE — Discharge Instructions (Signed)

## 2016-04-11 NOTE — MAU Provider Note (Signed)
History     CSN: [id number]  Arrival date and time: 04/10/16 2321   First Provider Initiated Contact with Patient 04/11/16 0008      Chief Complaint  Patient presents with  . Vaginal Discharge   Vaginal Discharge  The patient's primary symptoms include genital itching and vaginal discharge. This is a new problem. The current episode started 1 to 4 weeks ago. The problem occurs constantly. The problem has been gradually worsening. The problem affects both sides. She is not pregnant. Associated symptoms include nausea. Pertinent negatives include no abdominal pain, chills, constipation, diarrhea, dysuria, fever, frequency, urgency or vomiting. The vaginal discharge was thick and white. There has been no bleeding. Exacerbated by: patient states that this started after taking antibiotics for a UTI about 2 weeks ago.  Treatments tried: diflucan and monistat.  The treatment provided no relief. She is sexually active. She uses condoms for contraception. Her menstrual history has been regular (LMP 03/12/16 ).      Past Medical History:  Diagnosis Date  . Anemia   . Bacterial vaginosis    recurring  . Chlamydia   . Herpes    Type 1, lesion X 1  . Trichomonas     Past Surgical History:  Procedure Laterality Date  . CESAREAN SECTION    . cyst removed from R ear    . HERNIA REPAIR    . umbilical hernia      Family History  Problem Relation Age of Onset  . Anesthesia problems Neg Hx     Social History  Substance Use Topics  . Smoking status: Current Every Day Smoker    Packs/day: 5.00    Years: 6.00    Types: Cigarettes  . Smokeless tobacco: Never Used  . Alcohol use Yes     Comment: Occas. once per month    Allergies:  Allergies  Allergen Reactions  . Codeine Hives and Swelling    Prescriptions Prior to Admission  Medication Sig Dispense Refill Last Dose  . metroNIDAZOLE (METROGEL) 0.75 % gel Apply 1 application topically 2 (two) times daily.   Past Month at Unknown  time  . miconazole (MONISTAT 7) 2 % vaginal cream Place 1 Applicatorful vaginally at bedtime.   Past Week at Unknown time  . ibuprofen (ADVIL,MOTRIN) 200 MG tablet Take 400 mg by mouth every 6 (six) hours as needed for moderate pain.   More than a month at Unknown time  . triamcinolone cream (KENALOG) 0.1 % Apply 1 application topically 2 (two) times daily. (Patient not taking: Reported on 05/08/2015) 30 g 0 Completed Course at Unknown time    Review of Systems  Constitutional: Negative for chills and fever.  Gastrointestinal: Positive for nausea. Negative for abdominal pain, constipation, diarrhea and vomiting.  Genitourinary: Positive for vaginal discharge. Negative for dysuria, frequency and urgency.   Physical Exam   Blood pressure 138/80, pulse 91, temperature 98.3 F (36.8 C), temperature source Oral, resp. rate 18, last menstrual period 03/12/2016.  Physical Exam  Nursing note and vitals reviewed. Constitutional: She is oriented to person, place, and time. She appears well-developed and well-nourished. No distress.  Cardiovascular: Normal rate.   Respiratory: Effort normal.  GI: Soft. There is no tenderness. There is no rebound.  Genitourinary:  Genitourinary Comments:  External: no lesion Vagina: large amount of thick, white discharge Cervix: pink, smooth, no CMT Uterus: NSSC Adnexa: NT   Neurological: She is alert and oriented to person, place, and time.  Skin: Skin is warm and  dry.  Psychiatric: She has a normal mood and affect.   Results for orders placed or performed during the hospital encounter of 04/10/16 (from the past 24 hour(s))  Urinalysis, Routine w reflex microscopic (not at [facility])     Status: Abnormal   Collection Time: 04/10/16 11:35 PM  Result Value Ref Range   Color, Urine YELLOW YELLOW   APPearance CLEAR CLEAR   Specific Gravity, Urine 1.020 1.005 - 1.030   pH 5.5 5.0 - 8.0   Glucose, UA NEGATIVE NEGATIVE mg/dL   Hgb urine dipstick TRACE (A) NEGATIVE    Bilirubin Urine NEGATIVE NEGATIVE   Ketones, ur NEGATIVE NEGATIVE mg/dL   Protein, ur NEGATIVE NEGATIVE mg/dL   Nitrite NEGATIVE NEGATIVE   Leukocytes, UA TRACE (A) NEGATIVE  Urine microscopic-add on     Status: Abnormal   Collection Time: 04/10/16 11:35 PM  Result Value Ref Range   Squamous Epithelial / LPF 0-5 (A) NONE SEEN   WBC, UA 0-5 0 - 5 WBC/hpf   RBC / HPF 0-5 0 - 5 RBC/hpf   Bacteria, UA RARE (A) NONE SEEN   Urine-Other MUCOUS PRESENT   Wet prep, genital     Status: Abnormal   Collection Time: 04/11/16 12:51 AM  Result Value Ref Range   Yeast Wet Prep HPF POC NONE SEEN NONE SEEN   Trich, Wet Prep NONE SEEN NONE SEEN   Clue Cells Wet Prep HPF POC NONE SEEN NONE SEEN   WBC, Wet Prep HPF POC MANY (A) NONE SEEN   Sperm NONE SEEN     MAU Course  Procedures  MDM   Assessment and Plan   1. Yeast infection involving the vagina and surrounding area    DC home Comfort measures reviewed  RX: terazol 7 as directed, lactobacillus TID  Return to MAU as needed FU with OB as planned  Follow-up [place] .   Contact information: [place] [address] [phone]            Lipford Bud 04/11/2016, 12:47 AM

## 2016-04-17 ENCOUNTER — Inpatient Hospital Stay (HOSPITAL_COMMUNITY): Payer: BLUE CROSS/BLUE SHIELD

## 2016-04-17 ENCOUNTER — Encounter (HOSPITAL_COMMUNITY): Payer: Self-pay | Admitting: *Deleted

## 2016-04-17 ENCOUNTER — Inpatient Hospital Stay (HOSPITAL_COMMUNITY)
Admission: AD | Admit: 2016-04-17 | Discharge: 2016-04-17 | Disposition: A | Discharge status: Home / Self Care | Payer: BLUE CROSS/BLUE SHIELD | Source: Ambulatory Visit | Attending: Obstetrics & Gynecology | Admitting: Obstetrics & Gynecology

## 2016-04-17 DIAGNOSIS — R109 Unspecified abdominal pain: Secondary | ICD-10-CM | POA: Diagnosis not present

## 2016-04-17 DIAGNOSIS — D649 Anemia, unspecified: Secondary | ICD-10-CM | POA: Insufficient documentation

## 2016-04-17 DIAGNOSIS — Z885 Allergy status to narcotic agent status: Secondary | ICD-10-CM | POA: Diagnosis not present

## 2016-04-17 DIAGNOSIS — Z3A01 Less than 8 weeks gestation of pregnancy: Secondary | ICD-10-CM | POA: Insufficient documentation

## 2016-04-17 DIAGNOSIS — O9989 Other specified diseases and conditions complicating pregnancy, childbirth and the puerperium: Secondary | ICD-10-CM | POA: Insufficient documentation

## 2016-04-17 DIAGNOSIS — Z8619 Personal history of other infectious and parasitic diseases: Secondary | ICD-10-CM | POA: Diagnosis not present

## 2016-04-17 DIAGNOSIS — N8312 Corpus luteum cyst of left ovary: Secondary | ICD-10-CM | POA: Diagnosis not present

## 2016-04-17 DIAGNOSIS — O99332 Smoking (tobacco) complicating pregnancy, second trimester: Secondary | ICD-10-CM | POA: Diagnosis not present

## 2016-04-17 DIAGNOSIS — O200 Threatened abortion: Secondary | ICD-10-CM | POA: Diagnosis not present

## 2016-04-17 DIAGNOSIS — Z3201 Encounter for pregnancy test, result positive: Secondary | ICD-10-CM | POA: Diagnosis not present

## 2016-04-17 DIAGNOSIS — O26899 Other specified pregnancy related conditions, unspecified trimester: Secondary | ICD-10-CM

## 2016-04-17 LAB — URINALYSIS, ROUTINE W REFLEX MICROSCOPIC
Bilirubin Urine: NEGATIVE
Glucose, UA: NEGATIVE mg/dL
KETONES UR: NEGATIVE mg/dL
LEUKOCYTES UA: NEGATIVE
NITRITE: NEGATIVE
PROTEIN: NEGATIVE mg/dL
Specific Gravity, Urine: >1.03 — ABNORMAL HIGH (ref 1.005–1.030)
pH: 5.5 (ref 5.0–8.0)

## 2016-04-17 LAB — ABO/RH: ABO/RH(D): A POS

## 2016-04-17 LAB — CBC
HCT: 35.7 % — ABNORMAL LOW (ref 36.0–46.0)
Hemoglobin: 11.8 g/dL — ABNORMAL LOW (ref 12.0–15.0)
MCH: 29.6 pg (ref 26.0–34.0)
MCHC: 33.1 g/dL (ref 30.0–36.0)
MCV: 89.7 fL (ref 78.0–100.0)
PLATELETS: 358 10*3/uL (ref 150–400)
RBC: 3.98 MIL/uL (ref 3.87–5.11)
RDW: 15 % (ref 11.5–15.5)
WBC: 11.5 10*3/uL — AB (ref 4.0–10.5)

## 2016-04-17 LAB — URINE MICROSCOPIC-ADD ON

## 2016-04-17 LAB — OB RESULTS CONSOLE ANTIBODY SCREEN: Antibody Screen: NEGATIVE

## 2016-04-17 LAB — OB RESULTS CONSOLE RPR: RPR: NONREACTIVE

## 2016-04-17 LAB — OB RESULTS CONSOLE HEPATITIS B SURFACE ANTIGEN: Hepatitis B Surface Ag: NEGATIVE

## 2016-04-17 LAB — OB RESULTS CONSOLE RUBELLA ANTIBODY, IGM: RUBELLA: IMMUNE

## 2016-04-17 LAB — HIV ANTIBODY (ROUTINE TESTING W REFLEX): HIV Screen 4th Generation wRfx: NONREACTIVE

## 2016-04-17 LAB — POCT PREGNANCY, URINE: PREG TEST UR: POSITIVE — AB

## 2016-04-17 LAB — HCG, QUANTITATIVE, PREGNANCY: hCG, Beta Chain, Quant, S: 14564 m[IU]/mL — ABNORMAL HIGH (ref <5)

## 2016-04-17 LAB — OB RESULTS CONSOLE GC/CHLAMYDIA: Gonorrhea: NEGATIVE

## 2016-04-17 MED ORDER — CONCEPT OB 130-92.4-1 MG PO CAPS: 1.0000 | ORAL_CAPSULE | Freq: Every day | ORAL | 12 refills | Status: AC

## 2016-04-17 NOTE — MAU Note (Signed)
PT  SAYS SHE HAD  POSITIVE  HPT  ON 8-4   .   USES CONDOMS  FOR BC.    LAST SEX-   7-13.     WAS TREATED FOR YEAST  AND BV   LAST WEEK-   GAVE    TX -   NOW  THAT'S  ALL BETTER .    HAS HAD SPOTTING   ON  8-4.      HAS HAD CRAMPS  THROUGH  ALL  THIS  AND  NOW FEELS  SHARP PAIN ON LEFT  SIDE - STARTED   SAT.     TOOK TYLENOL  AT  7PM   REG STR  2  TABS .-   NO RELIEF

## 2016-04-17 NOTE — Discharge Instructions (Signed)

## 2016-04-17 NOTE — MAU Provider Note (Signed)
Chief Complaint: No chief complaint on file.   First Provider Initiated Contact with Patient 04/17/16 0145     SUBJECTIVE HPI: Katelyn Shaw is a 30 y.o. G2P1001 at [redacted]w[redacted]d who presents to Maternity Admissions reporting moderate left lower quadrant pain and spotting 04/15/2016. Positive home pregnancy test.  Location: Left lower quadrant Quality: Sharp Severity: 5/10 on pain scale Duration: 2 days Course: Worsening Context: None Timing: Intermittent Modifying factors: There are no aggravating or alleviating factors. Hasn't tried anything to treat the pain. Associated signs and symptoms: Positive for spotting negative for passage of tissue, urinary complaints, GI complaints, fever, chills, vaginal discharge.  Was seen in maternity admissions one week ago for vaginal discharge. Diagnosed with BV and yeast. Took medicine as directed and symptoms have resolved. GC/Chlamydia cultures negative at that visit.  Past Medical History:  Diagnosis Date  . Anemia   . Bacterial vaginosis    recurring  . Chlamydia   . Herpes    Type 1, lesion X 1  . Trichomonas    OB History  Gravida Para Term Preterm AB Living  2 1 1  0 0 1  SAB TAB Ectopic Multiple Live Births  0 0 0 0 1    # Outcome Date GA Lbr Len/2nd Weight Sex Delivery Anes PTL Lv  2 Current           1 Term     M CS-LTranv   LIV     Birth Comments: fetal distress     Past Surgical History:  Procedure Laterality Date  . CESAREAN SECTION    . cyst removed from R ear    . HERNIA REPAIR    . umbilical hernia     Social History   Social History  . Marital status: Single    Spouse name: N/A  . Number of children: N/A  . Years of education: N/A   Occupational History  . Not on file.   Social History Main Topics  . Smoking status: Current Every Day Smoker    Packs/day: 5.00    Years: 6.00    Types: Cigarettes  . Smokeless tobacco: Never Used  . Alcohol use Yes     Comment: Occas. once per month  . Drug use: No  .  Sexual activity: Yes    Birth control/ protection: None     Comment: Apr 16, 2014   Other Topics Concern  . Not on file   Social History Narrative  . No narrative on file   No current facility-administered medications on file prior to encounter.    Current Outpatient Prescriptions on File Prior to Encounter  Medication Sig Dispense Refill  . metroNIDAZOLE (METROGEL) 0.75 % gel Apply 1 application topically 2 (two) times daily.    . miconazole (MONISTAT 7) 2 % vaginal cream Place 1 Applicatorful vaginally at bedtime.    Marland Kitchen ibuprofen (ADVIL,MOTRIN) 200 MG tablet Take 400 mg by mouth every 6 (six) hours as needed for moderate pain.    Marland Kitchen lactobacillus acidophilus & bulgar (LACTINEX) chewable tablet Chew 1 tablet by mouth 3 (three) times daily with meals. 90 tablet 1  . terconazole (TERAZOL 7) 0.4 % vaginal cream Place 1 applicator vaginally at bedtime. For 7 nights 45 g 0   Allergies  Allergen Reactions  . Codeine Hives and Swelling    I have reviewed the past Medical Hx, Surgical Hx, Social Hx, Allergies and Medications.   Review of Systems  Constitutional: Negative for chills and fever.  Gastrointestinal:  Positive for abdominal pain. Negative for abdominal distention, constipation, diarrhea and vomiting.  Genitourinary: Positive for vaginal bleeding. Negative for dysuria, hematuria and vaginal discharge.    OBJECTIVE Patient Vitals for the past 24 hrs:  BP Temp Temp src Pulse Resp SpO2 Height Weight  04/17/16 0056 130/72 98.5 F (36.9 C) Oral 85 17 100 % 5\' 5"  (1.651 m) 190 lb (86.2 kg)   Constitutional: Well-developed, well-nourished female in no acute distress.  Cardiovascular: normal rate Respiratory: normal rate and effort.  GI: Abd soft, non-tender. Pos BS x 4 MS: Extremities nontender, no edema, normal ROM Neurologic: Alert and oriented x 4.  GU: Neg CVAT.  SPECULUM EXAM: Scant blood on pad.  LAB RESULTS Results for orders placed or performed during the hospital  encounter of 04/17/16 (from the past 24 hour(s))  Urinalysis, Routine w reflex microscopic (not at [facility])     Status: Abnormal   Collection Time: 04/17/16 12:55 AM  Result Value Ref Range   Color, Urine YELLOW YELLOW   APPearance CLEAR CLEAR   Specific Gravity, Urine >1.030 (H) 1.005 - 1.030   pH 5.5 5.0 - 8.0   Glucose, UA NEGATIVE NEGATIVE mg/dL   Hgb urine dipstick MODERATE (A) NEGATIVE   Bilirubin Urine NEGATIVE NEGATIVE   Ketones, ur NEGATIVE NEGATIVE mg/dL   Protein, ur NEGATIVE NEGATIVE mg/dL   Nitrite NEGATIVE NEGATIVE   Leukocytes, UA NEGATIVE NEGATIVE  Urine microscopic-add on     Status: Abnormal   Collection Time: 04/17/16 12:55 AM  Result Value Ref Range   Squamous Epithelial / LPF 0-5 (A) NONE SEEN   WBC, UA 0-5 0 - 5 WBC/hpf   RBC / HPF 0-5 0 - 5 RBC/hpf   Bacteria, UA FEW (A) NONE SEEN  Pregnancy, urine POC     Status: Abnormal   Collection Time: 04/17/16  1:27 AM  Result Value Ref Range   Preg Test, Ur POSITIVE (A) NEGATIVE  hCG, quantitative, pregnancy     Status: Abnormal   Collection Time: 04/17/16  1:56 AM  Result Value Ref Range   hCG, Beta Chain, Quant, S 14,564 (H) <5 mIU/mL  CBC     Status: Abnormal   Collection Time: 04/17/16  1:56 AM  Result Value Ref Range   WBC 11.5 (H) 4.0 - 10.5 K/uL   RBC 3.98 3.87 - 5.11 MIL/uL   Hemoglobin 11.8 (L) 12.0 - 15.0 g/dL   HCT 35.7 (L) 36.0 - 46.0 %   MCV 89.7 78.0 - 100.0 fL   MCH 29.6 26.0 - 34.0 pg   MCHC 33.1 30.0 - 36.0 g/dL   RDW 15.0 11.5 - 15.5 %   Platelets 358 150 - 400 K/uL  ABO/Rh     Status: None (Preliminary result)   Collection Time: 04/17/16  1:56 AM  Result Value Ref Range   ABO/RH(D) A POS     IMAGING US Ob Comp Less 14 Wks  Result Date: 04/17/2016 CLINICAL DATA:  Pregnant patient in first-trimester pregnancy with abdominal pain. Left-sided pelvic pain. EXAM: OBSTETRIC <14 WK [place] AND TRANSVAGINAL OB US TECHNIQUE: Both transabdominal and transvaginal ultrasound examinations were performed  for complete evaluation of the gestation as well as the maternal uterus, adnexal regions, and pelvic cul-de-sac. Transvaginal technique was performed to assess early pregnancy. COMPARISON:  None. FINDINGS: Intrauterine gestational sac: Single Yolk sac:  Present. Embryo:  Not visualized. Cardiac Activity: Not visualized. MSD: 9.3 mm   5 w   5  d Subchorionic hemorrhage:  None visualized. Maternal  uterus/adnexae: There is a 1.3 x 1.2 x 1.0 cm intramural fibroid in the right posterior lateral uterus. The right ovary is normal. The left ovary contains a corpus luteal cyst. No pelvic free fluid. IMPRESSION: Probable early intrauterine gestational sac and yolk sac, but no fetal pole or cardiac activity yet visualized. Recommend follow-up quantitative B-HCG levels and follow-up US in 10 days to confirm and assess viability. This recommendation follows SRU consensus guidelines: Diagnostic Criteria for Nonviable Pregnancy Early in the First Trimester. [place] Med [id number]. Electronically Signed   By: Jeb Levering M.D.   On: 04/17/2016 02:45   US Ob Transvaginal  Result Date: 04/17/2016 CLINICAL DATA:  Pregnant patient in first-trimester pregnancy with abdominal pain. Left-sided pelvic pain. EXAM: OBSTETRIC <14 WK [place] AND TRANSVAGINAL OB US TECHNIQUE: Both transabdominal and transvaginal ultrasound examinations were performed for complete evaluation of the gestation as well as the maternal uterus, adnexal regions, and pelvic cul-de-sac. Transvaginal technique was performed to assess early pregnancy. COMPARISON:  None. FINDINGS: Intrauterine gestational sac: Single Yolk sac:  Present. Embryo:  Not visualized. Cardiac Activity: Not visualized. MSD: 9.3 mm   5 w   5  d Subchorionic hemorrhage:  None visualized. Maternal uterus/adnexae: There is a 1.3 x 1.2 x 1.0 cm intramural fibroid in the right posterior lateral uterus. The right ovary is normal. The left ovary contains a corpus luteal cyst. No pelvic free  fluid. IMPRESSION: Probable early intrauterine gestational sac and yolk sac, but no fetal pole or cardiac activity yet visualized. Recommend follow-up quantitative B-HCG levels and follow-up US in 10 days to confirm and assess viability. This recommendation follows SRU consensus guidelines: Diagnostic Criteria for Nonviable Pregnancy Early in the First Trimester. [place] [employer] Med [id number]. Electronically Signed   By: Jeb Levering M.D.   On: 04/17/2016 02:45    MAU COURSE CBC, Quant, ABO/Rh, ultrasound, wet prep and GC/chlamydia culture, UA  MDM - Pain and bleeding in early pregnancy with normal intrauterine pregnancy and hemodynamically stable. Viability not yet confirmed. - Left lower quadrant pain likely from corpus luteum cyst.  ASSESSMENT 1. Threatened miscarriage   2. Abdominal pain affecting pregnancy, antepartum   3. Corpus luteum cyst of left ovary     PLAN Discharge home in stable condition. SAB and first trimester precautions Pregnancy verification letter given. Rx prenatal vitamins. List of providers given. Follow-up Information    Obstetrician of your choice .   Why:  Start prenatal care       [place] .   Why:  As needed in emergencies Contact information: [address] [id number] [place] [phone]           Medication List    STOP taking these medications   ibuprofen 200 MG tablet Commonly known as:  ADVIL,MOTRIN   miconazole 2 % vaginal cream Commonly known as:  MONISTAT 7     TAKE these medications   CONCEPT OB 130-92.4-1 MG Caps Take 1 tablet by mouth daily.   lactobacillus acidophilus & bulgar chewable tablet Chew 1 tablet by mouth 3 (three) times daily with meals.   metroNIDAZOLE 0.75 % gel Commonly known as:  METROGEL Apply 1 application topically 2 (two) times daily.   terconazole 0.4 % vaginal cream Commonly known as:  TERAZOL 7 Place 1  applicator vaginally at bedtime. For 7 nights        [place], [place] 04/17/2016  3:31 AM  4

## 2016-04-17 NOTE — MAU Note (Signed)
Pt reports positive home preg test. States she was seen here 08/01 for yeast and BV infection and was given treatment. Yesterday she began having sharp lower left abd pain and it is worsening.

## 2016-06-03 ENCOUNTER — Emergency Department (HOSPITAL_COMMUNITY)
Admission: EM | Admit: 2016-06-03 | Discharge: 2016-06-03 | Disposition: A | Discharge status: Home / Self Care | Payer: BLUE CROSS/BLUE SHIELD | Attending: Emergency Medicine | Admitting: Emergency Medicine

## 2016-06-03 ENCOUNTER — Encounter (HOSPITAL_COMMUNITY): Payer: Self-pay | Admitting: Emergency Medicine

## 2016-06-03 DIAGNOSIS — Z3A12 12 weeks gestation of pregnancy: Secondary | ICD-10-CM | POA: Diagnosis not present

## 2016-06-03 DIAGNOSIS — Y999 Unspecified external cause status: Secondary | ICD-10-CM | POA: Insufficient documentation

## 2016-06-03 DIAGNOSIS — Y92410 Unspecified street and highway as the place of occurrence of the external cause: Secondary | ICD-10-CM | POA: Diagnosis not present

## 2016-06-03 DIAGNOSIS — Z79899 Other long term (current) drug therapy: Secondary | ICD-10-CM | POA: Insufficient documentation

## 2016-06-03 DIAGNOSIS — S233XXA Sprain of ligaments of thoracic spine, initial encounter: Secondary | ICD-10-CM | POA: Insufficient documentation

## 2016-06-03 DIAGNOSIS — Y9389 Activity, other specified: Secondary | ICD-10-CM | POA: Insufficient documentation

## 2016-06-03 DIAGNOSIS — O99331 Smoking (tobacco) complicating pregnancy, first trimester: Secondary | ICD-10-CM | POA: Insufficient documentation

## 2016-06-03 DIAGNOSIS — Z3490 Encounter for supervision of normal pregnancy, unspecified, unspecified trimester: Secondary | ICD-10-CM

## 2016-06-03 DIAGNOSIS — O9A211 Injury, poisoning and certain other consequences of external causes complicating pregnancy, first trimester: Secondary | ICD-10-CM | POA: Insufficient documentation

## 2016-06-03 DIAGNOSIS — S239XXA Sprain of unspecified parts of thorax, initial encounter: Secondary | ICD-10-CM

## 2016-06-03 DIAGNOSIS — F17210 Nicotine dependence, cigarettes, uncomplicated: Secondary | ICD-10-CM | POA: Insufficient documentation

## 2016-06-03 MED ORDER — ACETAMINOPHEN 325 MG PO TABS
650.0000 mg | ORAL_TABLET | Freq: Once | ORAL | Status: AC
  Administered 2016-06-03: 650 mg via ORAL
  Filled 2016-06-03: qty 2

## 2016-06-03 MED ORDER — SODIUM CHLORIDE 0.9 % IV BOLUS (SEPSIS)
1000.0000 mL | Freq: Once | INTRAVENOUS | Status: AC
  Administered 2016-06-03: 1000 mL via INTRAVENOUS
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
## 2016-06-03 NOTE — Discharge Instructions (Signed)
You can safely take Tylenol for discomfort during your pregnancy  You can use Ice/cold therapy to the sore area of your back.  Please call your OB to inform of today's events just so they are aware, and arrange follow up visits
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
## 2016-06-03 NOTE — ED Notes (Signed)
Patient d/c'd self care.  F/U and pain management reviewed.  Patient verbalized understanding.

## 2016-06-27 ENCOUNTER — Inpatient Hospital Stay (HOSPITAL_COMMUNITY)
Admission: AD | Admit: 2016-06-27 | Discharge: 2016-06-28 | Disposition: A | Discharge status: Home / Self Care | Payer: BLUE CROSS/BLUE SHIELD | Source: Ambulatory Visit | Attending: Obstetrics & Gynecology | Admitting: Obstetrics & Gynecology

## 2016-06-27 DIAGNOSIS — W228XXA Striking against or struck by other objects, initial encounter: Secondary | ICD-10-CM | POA: Diagnosis not present

## 2016-06-27 DIAGNOSIS — R109 Unspecified abdominal pain: Secondary | ICD-10-CM | POA: Insufficient documentation

## 2016-06-27 DIAGNOSIS — W500XXA Accidental hit or strike by another person, initial encounter: Secondary | ICD-10-CM | POA: Diagnosis not present

## 2016-06-27 DIAGNOSIS — F17210 Nicotine dependence, cigarettes, uncomplicated: Secondary | ICD-10-CM | POA: Diagnosis not present

## 2016-06-27 DIAGNOSIS — S3991XA Unspecified injury of abdomen, initial encounter: Secondary | ICD-10-CM

## 2016-06-27 DIAGNOSIS — O26892 Other specified pregnancy related conditions, second trimester: Secondary | ICD-10-CM | POA: Insufficient documentation

## 2016-06-27 DIAGNOSIS — B9689 Other specified bacterial agents as the cause of diseases classified elsewhere: Secondary | ICD-10-CM | POA: Diagnosis not present

## 2016-06-27 DIAGNOSIS — N760 Acute vaginitis: Secondary | ICD-10-CM

## 2016-06-27 DIAGNOSIS — O99332 Smoking (tobacco) complicating pregnancy, second trimester: Secondary | ICD-10-CM | POA: Diagnosis not present

## 2016-06-27 DIAGNOSIS — Z3A15 15 weeks gestation of pregnancy: Secondary | ICD-10-CM | POA: Insufficient documentation

## 2016-06-27 DIAGNOSIS — O9A212 Injury, poisoning and certain other consequences of external causes complicating pregnancy, second trimester: Secondary | ICD-10-CM | POA: Diagnosis not present

## 2016-06-27 DIAGNOSIS — O23592 Infection of other part of genital tract in pregnancy, second trimester: Secondary | ICD-10-CM | POA: Insufficient documentation
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
## 2016-06-28 ENCOUNTER — Encounter (HOSPITAL_COMMUNITY): Payer: Self-pay

## 2016-06-28 DIAGNOSIS — O26892 Other specified pregnancy related conditions, second trimester: Secondary | ICD-10-CM | POA: Diagnosis not present

## 2016-06-28 DIAGNOSIS — S3991XA Unspecified injury of abdomen, initial encounter: Secondary | ICD-10-CM | POA: Diagnosis not present

## 2016-06-28 DIAGNOSIS — O9A212 Injury, poisoning and certain other consequences of external causes complicating pregnancy, second trimester: Secondary | ICD-10-CM | POA: Diagnosis not present

## 2016-06-28 DIAGNOSIS — Z3A15 15 weeks gestation of pregnancy: Secondary | ICD-10-CM

## 2016-06-28 DIAGNOSIS — W500XXA Accidental hit or strike by another person, initial encounter: Secondary | ICD-10-CM

## 2016-06-28 LAB — WET PREP, GENITAL
SPERM: NONE SEEN
TRICH WET PREP: NONE SEEN
YEAST WET PREP: NONE SEEN

## 2016-06-28 LAB — URINE MICROSCOPIC-ADD ON

## 2016-06-28 LAB — URINALYSIS, ROUTINE W REFLEX MICROSCOPIC
Bilirubin Urine: NEGATIVE
Glucose, UA: NEGATIVE mg/dL
HGB URINE DIPSTICK: NEGATIVE
Ketones, ur: NEGATIVE mg/dL
Nitrite: NEGATIVE
Protein, ur: NEGATIVE mg/dL
pH: 5.5 (ref 5.0–8.0)

## 2016-06-28 MED ORDER — METRONIDAZOLE 500 MG PO TABS: 500.0000 mg | ORAL_TABLET | Freq: Two times a day (BID) | ORAL | 0 refills | Status: DC
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
## 2016-06-28 NOTE — Discharge Instructions (Signed)
What Do I Need to Know About Injuries During Pregnancy? Trauma is the most common cause of injury and death in pregnant women. This can also result in significant harm or death of the baby. Your baby is protected in the womb (uterus) by a sac filled with fluid (amniotic sac). Your baby can be harmed if there is direct, high-impact trauma to your abdomen and pelvis. This type of trauma can result in tearing of your uterus, the placenta pulling away from the wall of the uterus (placenta abruption), or the amniotic sac breaking open (rupture of membranes). These injuries can decrease or stop the blood supply to your baby or cause you to go into labor earlier than expected. Minor falls and low-impact automobile accidents do not usually harm your baby, even if they do minimally harm you. WHAT KIND OF INJURIES CAN AFFECT MY PREGNANCY? The most common causes of injury or death to a baby include:  Falls. Falls are more common in the second and third trimester of the pregnancy. Factors that increase your risk of falling include:  Increase in your weight.  The change in your center of gravity.  Tripping over an object that cannot be seen.  Increased looseness (laxity) of your ligaments resulting in less coordinated movements (you may feel clumsy).  Falling during high-risk activities like horseback riding or skiing.  Automobile accidents. It is important to wear your seat belt properly, with the lap belt below your abdomen, and always practice safe driving.  Domestic violence or assault.  Burns (Building services engineer). The most common causes of injury or death to the pregnant woman include:  Injuries that cause severe bleeding, shock, and loss of blood flow to major organs.  Head and neck injuries that result in severe brain or spinal damage.  Chest trauma that can cause direct injury to the heart and lungs or any injury that affects the area enclosed by the ribs. Trauma to this area can result in  cardiorespiratory arrest. WHAT CAN I DO TO PROTECT MYSELF AND MY BABY FROM INJURY WHILE I AM PREGNANT?  Remove slippery rugs and loose objects on the floor that increase your risk of tripping.  Avoid walking on wet or slippery floors.  Wear comfortable shoes that have a good grip on the sole. Do not wear high-heeled shoes.  Always wear your seat belt properly, with the lap belt below your abdomen, and always practice safe driving. Do not ride on a motorcycle while pregnant.  Do not participate in high-impact activities or sports.  Avoid fires, starting fires, lifting heavy pots of boiling or hot liquids, and fixing electrical problems.  Only take over-the-counter or prescription medicines for pain, fever, or discomfort as directed by your health care provider.  Know your blood type and the father's blood type in case you develop vaginal bleeding or experience an injury for which a blood transfusion may be necessary.  Call your local emergency services (911 in the [place] if you are a victim of domestic violence or assault. Spousal abuse can be a significant cause of trauma during pregnancy. For help and support, contact the [employer]. WHEN SHOULD I SEEK IMMEDIATE MEDICAL CARE?   You fall on your abdomen or experience any high-force accident or injury.  You have been assaulted (domestic or otherwise).  You have been in a car accident.  You develop vaginal bleeding.  You develop fluid leaking from the vagina.  You develop uterine contractions (pelvic cramping, pain, or significant low back  pain).  You become weak or faint, or have uncontrolled vomiting after trauma.  You had a serious burn. This includes burns to the face, neck, hands, or genitals, or burns greater than the size of your palm anywhere else.  You develop neck stiffness or pain after a fall or from other trauma.  You develop a headache or vision problems after a fall or from other  trauma.  You do not feel the baby moving or the baby is not moving as much as before a fall or other trauma.   This information is not intended to replace advice given to you by your health care provider. Make sure you discuss any questions you have with your health care provider.   Document Released: 10/05/2004 Document Revised: 09/18/2014 Document Reviewed: 06/04/2013 Elsevier Interactive Patient Education [employer].

## 2016-09-01 ENCOUNTER — Inpatient Hospital Stay (HOSPITAL_COMMUNITY)
Admission: AD | Admit: 2016-09-01 | Discharge: 2016-09-01 | Disposition: A | Discharge status: Home / Self Care | Payer: BLUE CROSS/BLUE SHIELD | Source: Ambulatory Visit | Attending: Obstetrics and Gynecology | Admitting: Obstetrics and Gynecology

## 2016-09-01 ENCOUNTER — Encounter (HOSPITAL_COMMUNITY): Payer: Self-pay

## 2016-09-01 DIAGNOSIS — Z3689 Encounter for other specified antenatal screening: Secondary | ICD-10-CM

## 2016-09-01 DIAGNOSIS — Z79899 Other long term (current) drug therapy: Secondary | ICD-10-CM | POA: Insufficient documentation

## 2016-09-01 DIAGNOSIS — O26892 Other specified pregnancy related conditions, second trimester: Secondary | ICD-10-CM | POA: Insufficient documentation

## 2016-09-01 DIAGNOSIS — A09 Infectious gastroenteritis and colitis, unspecified: Secondary | ICD-10-CM

## 2016-09-01 DIAGNOSIS — R197 Diarrhea, unspecified: Secondary | ICD-10-CM | POA: Diagnosis not present

## 2016-09-01 DIAGNOSIS — R1030 Lower abdominal pain, unspecified: Secondary | ICD-10-CM | POA: Diagnosis not present

## 2016-09-01 DIAGNOSIS — O9989 Other specified diseases and conditions complicating pregnancy, childbirth and the puerperium: Secondary | ICD-10-CM | POA: Diagnosis not present

## 2016-09-01 DIAGNOSIS — F17210 Nicotine dependence, cigarettes, uncomplicated: Secondary | ICD-10-CM | POA: Insufficient documentation

## 2016-09-01 DIAGNOSIS — R109 Unspecified abdominal pain: Secondary | ICD-10-CM

## 2016-09-01 DIAGNOSIS — Z3A24 24 weeks gestation of pregnancy: Secondary | ICD-10-CM | POA: Diagnosis not present

## 2016-09-01 DIAGNOSIS — O99332 Smoking (tobacco) complicating pregnancy, second trimester: Secondary | ICD-10-CM | POA: Insufficient documentation

## 2016-09-01 DIAGNOSIS — O26899 Other specified pregnancy related conditions, unspecified trimester: Secondary | ICD-10-CM

## 2016-09-01 LAB — CBC
HEMATOCRIT: 32.1 % — AB (ref 36.0–46.0)
Hemoglobin: 10.5 g/dL — ABNORMAL LOW (ref 12.0–15.0)
MCH: 31.3 pg (ref 26.0–34.0)
MCHC: 32.7 g/dL (ref 30.0–36.0)
MCV: 95.5 fL (ref 78.0–100.0)
Platelets: 333 10*3/uL (ref 150–400)
RBC: 3.36 MIL/uL — ABNORMAL LOW (ref 3.87–5.11)
RDW: 13.7 % (ref 11.5–15.5)
WBC: 16.8 10*3/uL — ABNORMAL HIGH (ref 4.0–10.5)

## 2016-09-01 LAB — DIFFERENTIAL
BASOS PCT: 0 %
Basophils Absolute: 0 10*3/uL (ref 0.0–0.1)
Eosinophils Absolute: 0.3 10*3/uL (ref 0.0–0.7)
Eosinophils Relative: 2 %
LYMPHS PCT: 18 %
Lymphs Abs: 3 10*3/uL (ref 0.7–4.0)
Monocytes Absolute: 0.7 10*3/uL (ref 0.1–1.0)
Monocytes Relative: 4 %
NEUTROS PCT: 76 %
Neutro Abs: 12.9 10*3/uL — ABNORMAL HIGH (ref 1.7–7.7)

## 2016-09-01 LAB — COMPREHENSIVE METABOLIC PANEL
ALT: 16 U/L (ref 14–54)
ANION GAP: 8 (ref 5–15)
AST: 20 U/L (ref 15–41)
Albumin: 3.6 g/dL (ref 3.5–5.0)
Alkaline Phosphatase: 44 U/L (ref 38–126)
BILIRUBIN TOTAL: 0.4 mg/dL (ref 0.3–1.2)
BUN: 9 mg/dL (ref 6–20)
CO2: 25 mmol/L (ref 22–32)
Calcium: 9.2 mg/dL (ref 8.9–10.3)
Chloride: 103 mmol/L (ref 101–111)
Creatinine, Ser: 0.59 mg/dL (ref 0.44–1.00)
Glucose, Bld: 93 mg/dL (ref 65–99)
POTASSIUM: 3.6 mmol/L (ref 3.5–5.1)
Sodium: 136 mmol/L (ref 135–145)
TOTAL PROTEIN: 7.5 g/dL (ref 6.5–8.1)

## 2016-09-01 LAB — URINALYSIS, ROUTINE W REFLEX MICROSCOPIC
Bilirubin Urine: NEGATIVE
GLUCOSE, UA: NEGATIVE mg/dL
HGB URINE DIPSTICK: NEGATIVE
KETONES UR: NEGATIVE mg/dL
Leukocytes, UA: NEGATIVE
NITRITE: NEGATIVE
PROTEIN: 30 mg/dL — AB
Specific Gravity, Urine: 1.025 (ref 1.005–1.030)
pH: 6 (ref 5.0–8.0)

## 2016-09-01 LAB — WET PREP, GENITAL
Clue Cells Wet Prep HPF POC: NONE SEEN
Sperm: NONE SEEN
Trich, Wet Prep: NONE SEEN
Yeast Wet Prep HPF POC: NONE SEEN

## 2016-09-01 NOTE — MAU Note (Signed)
Past couple of days, has had intermittent cramping in lower abd no bleeding or leaking. Diarrhea started 3 days. No one at home with it.  No hx of PTL

## 2016-09-01 NOTE — MAU Provider Note (Signed)
History     CSN: [id number]  Arrival date and time: 09/01/16 1339   First Provider Initiated Contact with Patient 09/01/16 1414      Chief Complaint  Patient presents with  . Abdominal Cramping   G2P1001 @24 .5 weeks here with upper and lower abdominal pain x3 days. She describes the upper abd pain as cramping and intermittent and the lower abdominal pain as sharp and constant but worsens at times. Nothing worsens or improves the pain. She reports good FM. She denies VB, LOF, and ctx. She reports mucousy yellow vaginal discharge without malodor x2 days. She denies recent IC. She denies urinary sx. No fevers. She reports watery diarrhea x4-5 days that occurs 2-3 times per day. The diarrhea was more malodorous than usual today. 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    OB History    Gravida Para Term Preterm AB Living   2 1 1  0 0 1   SAB TAB Ectopic Multiple Live Births   0 0 0 0 1      Past Medical History:  Diagnosis Date  . Anemia   . Bacterial vaginosis    recurring  . Chlamydia   . Herpes    Type 1, lesion X 1  . Trichomonas     Past Surgical History:  Procedure Laterality Date  . CESAREAN SECTION    . cyst removed from R ear    . HERNIA REPAIR      Family History  Problem Relation Age of Onset  . Anesthesia problems Neg Hx     Social History  Substance Use Topics  . Smoking status: Current Every Day Smoker    Packs/day: 0.25    Years: 6.00    Types: Cigarettes  . Smokeless tobacco: Never Used  . Alcohol use Yes     Comment: Occas. once per month    Allergies:  Allergies  Allergen Reactions  . Codeine Hives and Swelling    Prescriptions Prior to Admission  Medication Sig Dispense Refill Last Dose  . acetaminophen (TYLENOL) 500 MG tablet Take 500 mg by mouth 3 (three) times daily as needed for moderate pain or headache.   Past Week at Unknown time  . metroNIDAZOLE (FLAGYL) 500 MG tablet Take 1  tablet (500 mg total) by mouth 2 (two) times daily. 14 tablet 0   . oxymetazoline (AFRIN) 0.05 % nasal spray Place 1 spray into both nostrils daily as needed for congestion.   2 weeks  . Prenat w/o A Vit-FeFum-FePo-FA (CONCEPT OB) 130-92.4-1 MG CAPS Take 1 tablet by mouth daily. 30 capsule 12 Past Week at Unknown time    Review of Systems  Constitutional: Negative.   Gastrointestinal: Positive for abdominal pain and diarrhea. Negative for heartburn, nausea and vomiting.  Genitourinary: Negative.    Physical Exam   Blood pressure 130/72, pulse 100, temperature 98.6 F (37 C), temperature source Oral, resp. rate 18, weight 92.4 kg (203 lb 9.6 oz), last menstrual period 03/12/2016.  Physical Exam  Constitutional: She is oriented to person, place, and time. She appears well-developed and well-nourished. No distress.  HENT:  Head: Normocephalic and atraumatic.  Neck: Normal range of motion.  Cardiovascular: Normal rate.   Respiratory: Effort normal.  GI: Soft. She exhibits no distension and no mass. There is tenderness (mild, left and right of umbilicus). There is no rebound and no guarding.  Genitourinary:  Genitourinary Comments: External: no lesions or erythema Vagina: rugated,  parous/ nulli, copious thick white discharge SVE: closed/thick  Musculoskeletal: Normal range of motion.  Neurological: She is alert and oriented to person, place, and time.  Skin: Skin is warm and dry.  Psychiatric: She has a normal mood and affect.   EFM: 140 bpm, mod variability, + accels, no decels Toco: none  Results for orders placed or performed during the hospital encounter of 09/01/16 (from the past 24 hour(s))  Urinalysis, Routine w reflex microscopic     Status: Abnormal   Collection Time: 09/01/16  2:04 PM  Result Value Ref Range   Color, Urine YELLOW YELLOW   APPearance HAZY (A) CLEAR   Specific Gravity, Urine 1.025 1.005 - 1.030   pH 6.0 5.0 - 8.0   Glucose, UA NEGATIVE NEGATIVE mg/dL    Hgb urine dipstick NEGATIVE NEGATIVE   Bilirubin Urine NEGATIVE NEGATIVE   Ketones, ur NEGATIVE NEGATIVE mg/dL   Protein, ur 30 (A) NEGATIVE mg/dL   Nitrite NEGATIVE NEGATIVE   Leukocytes, UA NEGATIVE NEGATIVE   RBC / HPF 0-5 0 - 5 RBC/hpf   WBC, UA 0-5 0 - 5 WBC/hpf   Bacteria, UA RARE (A) NONE SEEN   Squamous Epithelial / LPF 6-30 (A) NONE SEEN   Mucous PRESENT   Wet prep, genital     Status: Abnormal   Collection Time: 09/01/16  2:25 PM  Result Value Ref Range   Yeast Wet Prep HPF POC NONE SEEN NONE SEEN   Trich, Wet Prep NONE SEEN NONE SEEN   Clue Cells Wet Prep HPF POC NONE SEEN NONE SEEN   WBC, Wet Prep HPF POC MODERATE (A) NONE SEEN   Sperm NONE SEEN   CBC     Status: Abnormal   Collection Time: 09/01/16  3:03 PM  Result Value Ref Range   WBC 16.8 (H) 4.0 - 10.5 K/uL   RBC 3.36 (L) 3.87 - 5.11 MIL/uL   Hemoglobin 10.5 (L) 12.0 - 15.0 g/dL   HCT 32.1 (L) 36.0 - 46.0 %   MCV 95.5 78.0 - 100.0 fL   MCH 31.3 26.0 - 34.0 pg   MCHC 32.7 30.0 - 36.0 g/dL   RDW 13.7 11.5 - 15.5 %   Platelets 333 150 - 400 K/uL  Comprehensive metabolic panel     Status: None   Collection Time: 09/01/16  3:03 PM  Result Value Ref Range   Sodium 136 135 - 145 mmol/L   Potassium 3.6 3.5 - 5.1 mmol/L   Chloride 103 101 - 111 mmol/L   CO2 25 22 - 32 mmol/L   Glucose, Bld 93 65 - 99 mg/dL   BUN 9 6 - 20 mg/dL   Creatinine, Ser 0.59 0.44 - 1.00 mg/dL   Calcium 9.2 8.9 - 10.3 mg/dL   Total Protein 7.5 6.5 - 8.1 g/dL   Albumin 3.6 3.5 - 5.0 g/dL   AST 20 15 - 41 U/L   ALT 16 14 - 54 U/L   Alkaline Phosphatase 44 38 - 126 U/L   Total Bilirubin 0.4 0.3 - 1.2 mg/dL   GFR calc non Af Amer >60 >60 mL/min   GFR calc Af Amer >60 >60 mL/min   Anion gap 8 5 - 15  Differential     Status: Abnormal   Collection Time: 09/01/16  3:03 PM  Result Value Ref Range   Neutrophils Relative % 76 %   Neutro Abs 12.9 (H) 1.7 - 7.7 K/uL   Lymphocytes Relative 18 %   Lymphs Abs 3.0  0.7 - 4.0 K/uL   Monocytes  Relative 4 %   Monocytes Absolute 0.7 0.1 - 1.0 K/uL   Eosinophils Relative 2 %   Eosinophils Absolute 0.3 0.0 - 0.7 K/uL   Basophils Relative 0 %   Basophils Absolute 0.0 0.0 - 0.1 K/uL   MAU Course  Procedures  MDM Labs ordered and reviewed. No evidence of PTL or acute abdominal process. Suspect infectious diarrhea. Stable for discharge home.   Assessment and Plan   1. [redacted] weeks gestation of pregnancy   2. NST (non-stress test) reactive   3. Abdominal pain affecting pregnancy   4. Diarrhea of presumed infectious origin    Discharge home Follow up with [facility] as scheduled Stool for culture and c.diff (return sample to lab)  Allergies as of 09/01/2016      Reactions   Codeine Hives, Swelling      Medication List    STOP taking these medications   metroNIDAZOLE 500 MG tablet Commonly known as:  FLAGYL     TAKE these medications   acetaminophen 500 MG tablet Commonly known as:  TYLENOL Take 500 mg by mouth 3 (three) times daily as needed for moderate pain or headache.   CONCEPT OB 130-92.4-1 MG Caps Take 1 tablet by mouth daily.      Julianne Handler, CNM 09/01/2016, 2:16 PM

## 2016-09-01 NOTE — Discharge Instructions (Signed)
Diarrhea, Adult Introduction Diarrhea is when you have loose and water poop (stool) often. Diarrhea can make you feel weak and cause you to get dehydrated. Dehydration can make you tired and thirsty, make you have a dry mouth, and make it so you pee (urinate) less often. Diarrhea often lasts 2-3 days. However, it can last longer if it is a sign of something more serious. It is important to treat your diarrhea as told by your doctor. Follow these instructions at home: Eating and drinking Follow these recommendations as told by your doctor:  Take an oral rehydration solution (ORS). This is a drink that is sold at pharmacies and stores.  Drink clear fluids, such as:  Water.  Ice chips.  Diluted fruit juice.  Low-calorie sports drinks.  Eat bland, easy-to-digest foods in small amounts as you are able. These foods include:  Bananas.  Applesauce.  Rice.  Low-fat (lean) meats.  Toast.  Crackers.  Avoid drinking fluids that have a lot of sugar or caffeine in them.  Avoid alcohol.  Avoid spicy or fatty foods. General instructions  Drink enough fluid to keep your pee (urine) clear or pale yellow.  Wash your hands often. If you cannot use soap and water, use hand sanitizer.  Make sure that all people in your home wash their hands well and often.  Take over-the-counter and prescription medicines only as told by your doctor.  Rest at home while you get better.  Watch your condition for any changes.  Take a warm bath to help with any burning or pain from having diarrhea.  Keep all follow-up visits as told by your doctor. This is important. Contact a doctor if:  You have a fever.  Your diarrhea gets worse.  You have new symptoms.  You cannot keep fluids down.  You feel light-headed or dizzy.  You have a headache.  You have muscle cramps. Get help right away if:  You have chest pain.  You feel very weak or you pass out (faint).  You have bloody or black  poop or poop that look like tar.  You have very bad pain, cramping, or bloating in your belly (abdomen).  You have trouble breathing or you are breathing very quickly.  Your heart is beating very quickly.  Your skin feels cold and clammy.  You feel confused.  You have signs of dehydration, such as:  Dark pee, hardly any pee, or no pee.  Cracked lips.  Dry mouth.  Sunken eyes.  Sleepiness.  Weakness. This information is not intended to replace advice given to you by your health care provider. Make sure you discuss any questions you have with your health care provider. Document Released: 02/14/2008 Document Revised: 03/17/2016 Document Reviewed: 05/04/2015  2017 Elsevier Food Choices to Help Relieve Diarrhea, Adult When you have diarrhea, the foods you eat and your eating habits are very important. Choosing the right foods and drinks can help relieve diarrhea. Also, because diarrhea can last up to 7 days, you need to replace lost fluids and electrolytes (such as sodium, potassium, and chloride) in order to help prevent dehydration. What general guidelines do I need to follow?  Slowly drink 1 cup (8 oz) of fluid for each episode of diarrhea. If you are getting enough fluid, your urine will be clear or pale yellow.  Eat starchy foods. Some good choices include white rice, white toast, pasta, low-fiber cereal, baked potatoes (without the skin), saltine crackers, and bagels.  Avoid large servings of any cooked  vegetables.  Limit fruit to two servings per day. A serving is  cup or 1 small piece.  Choose foods with less than 2 g of fiber per serving.  Limit fats to less than 8 tsp (38 g) per day.  Avoid fried foods.  Eat foods that have probiotics in them. Probiotics can be found in certain dairy products.  Avoid foods and beverages that may increase the speed at which food moves through the stomach and intestines (gastrointestinal tract). Things to avoid  include:  High-fiber foods, such as dried fruit, raw fruits and vegetables, nuts, seeds, and whole grain foods.  Spicy foods and high-fat foods.  Foods and beverages sweetened with high-fructose corn syrup, honey, or sugar alcohols such as xylitol, sorbitol, and mannitol. What foods are recommended? Grains  White rice. White, [place], or pita breads (fresh or toasted), including plain rolls, buns, or bagels. White pasta. Saltine, soda, or graham crackers. Pretzels. Low-fiber cereal. Cooked cereals made with water (such as cornmeal, farina, or cream cereals). Plain muffins. Matzo. Melba toast. Zwieback. Vegetables  Potatoes (without the skin). Strained tomato and vegetable juices. Most well-cooked and canned vegetables without seeds. Tender lettuce. Fruits  Cooked or canned applesauce, apricots, cherries, fruit cocktail, grapefruit, peaches, pears, or plums. Fresh bananas, apples without skin, cherries, grapes, cantaloupe, grapefruit, peaches, oranges, or plums. Meat and Other Protein Products  Baked or boiled chicken. Eggs. Tofu. Fish. Seafood. Smooth peanut butter. Ground or well-cooked tender beef, ham, veal, lamb, pork, or poultry. Dairy  Plain yogurt, kefir, and unsweetened liquid yogurt. Lactose-free milk, buttermilk, or soy milk. Plain hard cheese. Beverages  Sport drinks. Clear broths. Diluted fruit juices (except prune). Regular, caffeine-free sodas such as ginger ale. Water. Decaffeinated teas. Oral rehydration solutions. Sugar-free beverages not sweetened with sugar alcohols. Other  Bouillon, broth, or soups made from recommended foods. The items listed above may not be a complete list of recommended foods or beverages. Contact your dietitian for more options.  What foods are not recommended? Grains  Whole grain, whole wheat, bran, or rye breads, rolls, pastas, crackers, and cereals. Wild or brown rice. Cereals that contain more than 2 g of fiber per serving. Corn tortillas or taco  shells. Cooked or dry oatmeal. Granola. Popcorn. Vegetables  Raw vegetables. Cabbage, broccoli, Brussels sprouts, artichokes, baked beans, beet greens, corn, kale, legumes, peas, sweet potatoes, and yams. Potato skins. Cooked spinach and cabbage. Fruits  Dried fruit, including raisins and dates. Raw fruits. Stewed or dried prunes. Fresh apples with skin, apricots, mangoes, pears, raspberries, and strawberries. Meat and Other Protein Products  Chunky peanut butter. Nuts and seeds. Beans and lentils. Berniece Salines. Dairy  High-fat cheeses. Milk, chocolate milk, and beverages made with milk, such as milk shakes. Cream. Ice cream. Sweets and Desserts  Sweet rolls, doughnuts, and sweet breads. Pancakes and waffles. Fats and Oils  Butter. Cream sauces. Margarine. Salad oils. Plain salad dressings. Olives. Avocados. Beverages  Caffeinated beverages (such as coffee, tea, soda, or energy drinks). Alcoholic beverages. Fruit juices with pulp. Prune juice. Soft drinks sweetened with high-fructose corn syrup or sugar alcohols. Other  Coconut. Hot sauce. Chili powder. Mayonnaise. Gravy. Cream-based or milk-based soups. The items listed above may not be a complete list of foods and beverages to avoid. Contact your dietitian for more information.  What should I do if I become dehydrated? Diarrhea can sometimes lead to dehydration. Signs of dehydration include dark urine and dry mouth and skin. If you think you are dehydrated, you should rehydrate with an  oral rehydration solution. These solutions can be purchased at pharmacies, retail stores, or online. Drink -1 cup (120-240 mL) of oral rehydration solution each time you have an episode of diarrhea. If drinking this amount makes your diarrhea worse, try drinking smaller amounts more often. For example, drink 1-3 tsp (5-15 mL) every 5-10 minutes. A general rule for staying hydrated is to drink 1-2 L of fluid per day. Talk to your health care provider about the  specific amount you should be drinking each day. Drink enough fluids to keep your urine clear or pale yellow. This information is not intended to replace advice given to you by your health care provider. Make sure you discuss any questions you have with your health care provider. Document Released: 11/18/2003 Document Revised: 02/03/2016 Document Reviewed: 07/21/2013 Elsevier Interactive Patient Education  2017 [employer].

## 2016-09-03 ENCOUNTER — Other Ambulatory Visit (HOSPITAL_COMMUNITY): Payer: Self-pay | Admitting: Lab

## 2016-09-03 ENCOUNTER — Other Ambulatory Visit: Payer: Self-pay | Admitting: Obstetrics and Gynecology

## 2016-09-03 ENCOUNTER — Other Ambulatory Visit (HOSPITAL_COMMUNITY): Payer: Self-pay | Admitting: Obstetrics and Gynecology

## 2016-09-03 DIAGNOSIS — R197 Diarrhea, unspecified: Secondary | ICD-10-CM

## 2016-09-03 NOTE — MAU Note (Signed)
Lab called, are unable to "cdiff" on specimen that she brought in today because it was a formed stool.

## 2016-09-05 LAB — GC/CHLAMYDIA PROBE AMP (~~LOC~~) NOT AT ARMC
Chlamydia: NEGATIVE
NEISSERIA GONORRHEA: NEGATIVE

## 2016-09-08 LAB — STOOL CULTURE REFLEX - RSASHR

## 2016-09-08 LAB — STOOL CULTURE: E coli, Shiga toxin Assay: NEGATIVE

## 2016-09-08 LAB — STOOL CULTURE REFLEX - CMPCXR

## 2016-09-11 NOTE — L&D Delivery Note (Signed)
Operative Delivery Note At  a viable female was delivered via vacuum assisted vaginal delivery after cesarean (VBAC).  Presentation: vertex; Position: Right,, Occiput,, Anterior; vacuum applied at Station: +3.  Verbal consent: obtained from patient.  Risks and benefits discussed in detail.  Risks include, but are not limited to the risks of anesthesia, bleeding, infection, damage to maternal tissues, fetal cephalhematoma.  There is also the risk of inability to effect vaginal delivery of the head, or shoulder dystocia that cannot be resolved by established maneuvers, leading to the need for emergency cesarean section.  Shoulder Dystocia Delivery of the head:  01:36 First maneuver:  McRoberts 01:36 Second maneuver:  Suprapubic pressure 01:36 Third maneuver: , Wood Screw 01:37 Fourth maneuver: , Posterior Arm 01:37 Fifth maneuver: ,  N/A Sixth maneuver: ,  N/A  Total time of shoulder dystocia: 90 seconds (1 min 30 sec)  Verbal consent: obtained from patient.  Anesthesia:  Epidural Episiotomy: None Lacerations: 2nd degree perineal with left sulcal extension Suture Repair: 3.0 Est. Blood Loss (mL): 200cc   APGAR:  APGAR (1 MIN): 4 APGAR (5 MINS): 6   APGAR (10 MINS): 9   Weight 8 lb 12 oz   Placenta status: Delivered spontaneously intact.   Cord: 3VC with the following complications: None.  Arterial cord gas pH: 7.10  Mom to postpartum.  Baby to Couplet care / Skin to Skin.  Katherine Basset, DO OB Fellow 12/24/2016, 2:35 AM

## 2016-10-25 ENCOUNTER — Inpatient Hospital Stay (HOSPITAL_COMMUNITY)
Admission: AD | Admit: 2016-10-25 | Discharge: 2016-10-26 | Disposition: A | Discharge status: Home / Self Care | Payer: BLUE CROSS/BLUE SHIELD | Source: Ambulatory Visit | Attending: Obstetrics and Gynecology | Admitting: Obstetrics and Gynecology

## 2016-10-25 DIAGNOSIS — O99333 Smoking (tobacco) complicating pregnancy, third trimester: Secondary | ICD-10-CM | POA: Diagnosis not present

## 2016-10-25 DIAGNOSIS — O4703 False labor before 37 completed weeks of gestation, third trimester: Secondary | ICD-10-CM | POA: Insufficient documentation

## 2016-10-25 DIAGNOSIS — F17210 Nicotine dependence, cigarettes, uncomplicated: Secondary | ICD-10-CM | POA: Insufficient documentation

## 2016-10-25 DIAGNOSIS — O479 False labor, unspecified: Secondary | ICD-10-CM

## 2016-10-25 DIAGNOSIS — Z3A32 32 weeks gestation of pregnancy: Secondary | ICD-10-CM | POA: Insufficient documentation

## 2016-10-26 ENCOUNTER — Encounter (HOSPITAL_COMMUNITY): Payer: Self-pay

## 2016-10-26 DIAGNOSIS — O479 False labor, unspecified: Secondary | ICD-10-CM | POA: Diagnosis not present

## 2016-10-26 DIAGNOSIS — O4703 False labor before 37 completed weeks of gestation, third trimester: Secondary | ICD-10-CM | POA: Diagnosis not present

## 2016-10-26 LAB — URINALYSIS, ROUTINE W REFLEX MICROSCOPIC
BILIRUBIN URINE: NEGATIVE
GLUCOSE, UA: NEGATIVE mg/dL
HGB URINE DIPSTICK: NEGATIVE
Ketones, ur: 20 mg/dL — AB
NITRITE: NEGATIVE
PROTEIN: NEGATIVE mg/dL
Specific Gravity, Urine: 1.015 (ref 1.005–1.030)
pH: 5 (ref 5.0–8.0)

## 2016-10-26 NOTE — MAU Note (Signed)
Presents with reports of contractions approximately every 7-10 minutes that began increasing in regularity/strength in the past two hours. Rates pain 5/10 currently.  States she is having some watery discharge that started yesterday.  No vaginal bleeding.  Reports slight decrease in fetal movement.  Last fetal movement detected occured within the past hour.

## 2016-10-26 NOTE — Discharge Instructions (Signed)
Braxton Hicks Contractions °Contractions of the uterus can occur throughout pregnancy. Contractions are not always a sign that you are in labor.  °WHAT ARE BRAXTON HICKS CONTRACTIONS?  °Contractions that occur before labor are called Braxton Hicks contractions, or false labor. Toward the end of pregnancy (32-34 weeks), these contractions can develop more often and may become more forceful. This is not true labor because these contractions do not result in opening (dilatation) and thinning of the cervix. They are sometimes difficult to tell apart from true labor because these contractions can be forceful and people have different pain tolerances. You should not feel embarrassed if you go to the hospital with false labor. Sometimes, the only way to tell if you are in true labor is for your health care provider to look for changes in the cervix. °If there are no prenatal problems or other health problems associated with the pregnancy, it is completely safe to be sent home with false labor and await the onset of true labor. °HOW CAN YOU TELL THE DIFFERENCE BETWEEN TRUE AND FALSE LABOR? °False Labor  °· The contractions of false labor are usually shorter and not as hard as those of true labor.   °· The contractions are usually irregular.   °· The contractions are often felt in the front of the lower abdomen and in the groin.   °· The contractions may go away when you walk around or change positions while lying down.   °· The contractions get weaker and are shorter lasting as time goes on.   °· The contractions do not usually become progressively stronger, regular, and closer together as with true labor.   °True Labor  °· Contractions in true labor last 30-70 seconds, become very regular, usually become more intense, and increase in frequency.   °· The contractions do not go away with walking.   °· The discomfort is usually felt in the top of the uterus and spreads to the lower abdomen and low back.   °· True labor can be  determined by your health care provider with an exam. This will show that the cervix is dilating and getting thinner.   °WHAT TO REMEMBER °· Keep up with your usual exercises and follow other instructions given by your health care provider.   °· Take medicines as directed by your health care provider.   °· Keep your regular prenatal appointments.   °· Eat and drink lightly if you think you are going into labor.   °· If Braxton Hicks contractions are making you uncomfortable:   °¨ Change your position from lying down or resting to walking, or from walking to resting.   °¨ Sit and rest in a tub of warm water.   °¨ Drink 2-3 glasses of water. Dehydration may cause these contractions.   °¨ Do slow and deep breathing several times an hour.   °WHEN SHOULD I SEEK IMMEDIATE MEDICAL CARE? °Seek immediate medical care if: °· Your contractions become stronger, more regular, and closer together.   °· You have fluid leaking or gushing from your vagina.   °· You have a fever.   °· You pass blood-tinged mucus.   °· You have vaginal bleeding.   °· You have continuous abdominal pain.   °· You have low back pain that you never had before.   °· You feel your baby's head pushing down and causing pelvic pressure.   °· Your baby is not moving as much as it used to.   °This information is not intended to replace advice given to you by your health care provider. Make sure you discuss any questions you have with your health care   provider. °Document Released: 08/28/2005 Document Revised: 12/20/2015 Document Reviewed: 06/09/2013 °Elsevier Interactive Patient Education © 2017 Elsevier Inc. ° °

## 2016-10-26 NOTE — MAU Provider Note (Signed)
History     CSN: [id number]  Arrival date and time: 10/25/16 2347   First Provider Initiated Contact with Patient 10/26/16 0046      Chief Complaint  Patient presents with  . Contractions   HPI HPI  Katelyn Shaw is a 31yo G2P1001 @ 32.4wks who presents for eval of reg mild ctx going on this evening. Denies leaking or bldg. Reports white vag d/c; no pruritis. Her preg has been followed by the [facility] and has been essentially unremarkable.  OB History    Gravida Para Term Preterm AB Living   2 1 1  0 0 1   SAB TAB Ectopic Multiple Live Births   0 0 0 0 1      Past Medical History:  Diagnosis Date  . Anemia   . Bacterial vaginosis    recurring  . Chlamydia   . Herpes    Type 1, lesion X 1  . Trichomonas     Past Surgical History:  Procedure Laterality Date  . CESAREAN SECTION    . cyst removed from R ear    . HERNIA REPAIR      Family History  Problem Relation Age of Onset  . Anesthesia problems Neg Hx     Social History  Substance Use Topics  . Smoking status: Current Every Day Smoker    Packs/day: 0.25    Years: 6.00    Types: Cigarettes  . Smokeless tobacco: Never Used  . Alcohol use Yes     Comment: Occas. once per month    Allergies:  Allergies  Allergen Reactions  . Codeine Hives and Swelling    Prescriptions Prior to Admission  Medication Sig Dispense Refill Last Dose  . acetaminophen (TYLENOL) 500 MG tablet Take 500 mg by mouth 3 (three) times daily as needed for moderate pain or headache.   Past Week at Unknown time  . Prenat w/o A Vit-FeFum-FePo-FA (CONCEPT OB) 130-92.4-1 MG CAPS Take 1 tablet by mouth daily. 30 capsule 12 08/31/2016 at Unknown time    Review of Systems No other pertinents other than what is listed in HPI  Physical Exam   Blood pressure 137/72, pulse 89, temperature 98 F (36.7 C), temperature source Oral, resp. rate 17, last menstrual period 03/12/2016.  Physical Exam  Constitutional: She is oriented to person, place,  and time. She appears well-developed.  HENT:  Head: Normocephalic.  Neck: Normal range of motion.  Cardiovascular: Normal rate.   Respiratory: Effort normal.  GI:  EFM +accels, occ mi variables Ctx irreg, mild, no pattern  Genitourinary:  Genitourinary Comments: Cx C/L  Musculoskeletal: Normal range of motion.  Neurological: She is alert and oriented to person, place, and time.  Skin: Skin is warm and dry.  Psychiatric: She has a normal mood and affect. Her behavior is normal. Thought content normal.   Urinalysis    Component Value Date/Time   COLORURINE YELLOW 10/25/2016 2354   APPEARANCEUR CLOUDY (A) 10/25/2016 2354   LABSPEC 1.015 10/25/2016 2354   PHURINE 5.0 10/25/2016 2354   GLUCOSEU NEGATIVE 10/25/2016 2354   HGBUR NEGATIVE 10/25/2016 2354   BILIRUBINUR NEGATIVE 10/25/2016 2354   KETONESUR 20 (A) 10/25/2016 2354   PROTEINUR NEGATIVE 10/25/2016 2354   UROBILINOGEN 0.2 05/09/2015 0047   NITRITE NEGATIVE 10/25/2016 2354   LEUKOCYTESUR TRACE (A) 10/25/2016 2354   Micro: many bacteria, SE TNTC  MAU Course  Procedures  MDM UA ordered  Assessment and Plan  IUP@32 .4wks Braxton Hicks ctx  D/C home with PTL/ROM/bldg  precautions F/U as scheduled at next OB visit  Serita Grammes CNM 10/26/2016, 12:49 AM

## 2016-11-23 LAB — OB RESULTS CONSOLE GBS: STREP GROUP B AG: NEGATIVE

## 2016-12-05 ENCOUNTER — Encounter (HOSPITAL_COMMUNITY): Payer: Self-pay | Admitting: *Deleted

## 2016-12-05 ENCOUNTER — Inpatient Hospital Stay (HOSPITAL_BASED_OUTPATIENT_CLINIC_OR_DEPARTMENT_OTHER)
Admit: 2016-12-05 | Discharge: 2016-12-05 | Disposition: A | Discharge status: Home / Self Care | Payer: BLUE CROSS/BLUE SHIELD | Attending: Student | Admitting: Student

## 2016-12-05 ENCOUNTER — Other Ambulatory Visit: Payer: Self-pay

## 2016-12-05 ENCOUNTER — Inpatient Hospital Stay (HOSPITAL_COMMUNITY)
Admission: AD | Admit: 2016-12-05 | Discharge: 2016-12-05 | Disposition: A | Discharge status: Home / Self Care | Payer: BLUE CROSS/BLUE SHIELD | Source: Ambulatory Visit | Attending: Family Medicine | Admitting: Family Medicine

## 2016-12-05 ENCOUNTER — Inpatient Hospital Stay (HOSPITAL_COMMUNITY): Payer: BLUE CROSS/BLUE SHIELD

## 2016-12-05 DIAGNOSIS — Z3A38 38 weeks gestation of pregnancy: Secondary | ICD-10-CM | POA: Diagnosis not present

## 2016-12-05 DIAGNOSIS — M7989 Other specified soft tissue disorders: Secondary | ICD-10-CM | POA: Diagnosis not present

## 2016-12-05 DIAGNOSIS — Z79899 Other long term (current) drug therapy: Secondary | ICD-10-CM | POA: Insufficient documentation

## 2016-12-05 DIAGNOSIS — M79662 Pain in left lower leg: Secondary | ICD-10-CM | POA: Diagnosis present

## 2016-12-05 DIAGNOSIS — O368390 Maternal care for abnormalities of the fetal heart rate or rhythm, unspecified trimester, not applicable or unspecified: Secondary | ICD-10-CM

## 2016-12-05 DIAGNOSIS — O99333 Smoking (tobacco) complicating pregnancy, third trimester: Secondary | ICD-10-CM | POA: Insufficient documentation

## 2016-12-05 DIAGNOSIS — O1203 Gestational edema, third trimester: Secondary | ICD-10-CM | POA: Diagnosis not present

## 2016-12-05 DIAGNOSIS — O26893 Other specified pregnancy related conditions, third trimester: Secondary | ICD-10-CM | POA: Diagnosis not present

## 2016-12-05 DIAGNOSIS — M79609 Pain in unspecified limb: Secondary | ICD-10-CM | POA: Diagnosis not present

## 2016-12-05 DIAGNOSIS — F17210 Nicotine dependence, cigarettes, uncomplicated: Secondary | ICD-10-CM | POA: Insufficient documentation

## 2016-12-05 LAB — URINALYSIS, ROUTINE W REFLEX MICROSCOPIC
Bilirubin Urine: NEGATIVE
Glucose, UA: NEGATIVE mg/dL
HGB URINE DIPSTICK: NEGATIVE
Ketones, ur: 20 mg/dL — AB
LEUKOCYTES UA: NEGATIVE
Nitrite: NEGATIVE
PROTEIN: NEGATIVE mg/dL
Specific Gravity, Urine: 1.021 (ref 1.005–1.030)
pH: 5 (ref 5.0–8.0)

## 2016-12-05 MED ORDER — LACTATED RINGERS IV BOLUS (SEPSIS): 1000.0000 mL | Freq: Once | INTRAVENOUS | Status: DC

## 2016-12-05 NOTE — MAU Provider Note (Signed)
History     CSN: [id number]  Arrival date and time: 12/05/16 1643   First Provider Initiated Contact with Patient 12/05/16 1729      Chief Complaint  Patient presents with  . Leg Pain   HPI Katelyn Shaw is a 31 y.o. G2P1001 at [redacted]w[redacted]d who presents with calf pain & SOB. Complains of BLE edema & left calf pain since yesterday. Rates pain 3/10 & constant. Pain worse with walking. Has not taken anything for pain. Also reports "mild" SOB today. Denies cough, wheezing, chest pain, fever. No history of VT or PE. Denies abdominal pain, vaginal bleeding, or LOF. Positive fetal movement. Goes to [facility] for [facility]; next appt next Monday.   OB History    Gravida Para Term Preterm AB Living   2 1 1  0 0 1   SAB TAB Ectopic Multiple Live Births   0 0 0 0 1      Past Medical History:  Diagnosis Date  . Anemia   . Bacterial vaginosis    recurring  . Chlamydia   . Herpes    Type 1, lesion X 1  . Trichomonas     Past Surgical History:  Procedure Laterality Date  . CESAREAN SECTION    . cyst removed from R ear    . HERNIA REPAIR      Family History  Problem Relation Age of Onset  . Anesthesia problems Neg Hx     Social History  Substance Use Topics  . Smoking status: Current Every Day Smoker    Packs/day: 0.25    Years: 6.00    Types: Cigarettes  . Smokeless tobacco: Never Used  . Alcohol use Yes     Comment: Occas. once per month    Allergies:  Allergies  Allergen Reactions  . Codeine Hives and Swelling    Prescriptions Prior to Admission  Medication Sig Dispense Refill Last Dose  . acetaminophen (TYLENOL) 500 MG tablet Take 1,000 mg by mouth 3 (three) times daily as needed for moderate pain or headache.    Past Month at Unknown time  . Prenat w/o A Vit-FeFum-FePo-FA (CONCEPT OB) 130-92.4-1 MG CAPS Take 1 tablet by mouth daily. 30 capsule 12 12/05/2016 at Unknown time    Review of Systems  Constitutional: Negative.   Respiratory: Positive for shortness of  breath. Negative for apnea, cough and wheezing.   Cardiovascular: Positive for leg swelling. Negative for chest pain and palpitations.  Gastrointestinal: Negative.   Genitourinary: Negative.   Musculoskeletal:       + left calf pain  Neurological: Negative for syncope.   Physical Exam   Blood pressure (!) 115/55, pulse 90, temperature 98.1 F (36.7 C), temperature source Oral, resp. rate 18, height 5\' 4"  (1.626 m), weight 221 lb (100.2 kg), last menstrual period 03/12/2016, SpO2 99 %.  Temp:  [98.1 F (36.7 C)-99.5 F (37.5 C)] 98.1 F (36.7 C) (03/27 2006) Pulse Rate:  [90-135] 90 (03/27 2006) Resp:  [18] 18 (03/27 2006) BP: (115-123)/(55-70) 115/55 (03/27 2006) SpO2:  [99 %] 99 % (03/27 1734) Weight:  [221 lb (100.2 kg)] 221 lb (100.2 kg) (03/27 1701)   Physical Exam  Nursing note and vitals reviewed. Constitutional: She is oriented to person, place, and time. She appears well-developed and well-nourished. No distress.  HENT:  Head: Normocephalic and atraumatic.  Eyes: Conjunctivae are normal. Right eye exhibits no discharge. Left eye exhibits no discharge. No scleral icterus.  Neck: Normal range of motion.  Cardiovascular: Normal  rate, regular rhythm and normal heart sounds.   No murmur heard. Pulses:      Dorsalis pedis pulses are 2+ on the right side, and 2+ on the left side.  Respiratory: Effort normal and breath sounds normal. No respiratory distress. She has no wheezes.  GI: Soft. There is no tenderness.  Musculoskeletal: She exhibits edema (BLE 2+ edema).       Left lower leg: She exhibits edema. She exhibits no tenderness.  Bilateral calves equal size, negative homans, negative tenderness/erythema/warmth  Neurological: She is alert and oriented to person, place, and time.  Skin: Skin is warm and dry. She is not diaphoretic.  Psychiatric: She has a normal mood and affect. Her behavior is normal. Judgment and thought content normal.   Fetal Tracing:  Baseline:  145 Variability: moderate Accelerations: 15x15 Decelerations: variable vs MHR vs artifact  Toco: none MAU Course  Procedures Results for orders placed or performed during the hospital encounter of 12/05/16 (from the past 24 hour(s))  Urinalysis, Routine w reflex microscopic     Status: Abnormal   Collection Time: 12/05/16  5:32 PM  Result Value Ref Range   Color, Urine YELLOW YELLOW   APPearance CLOUDY (A) CLEAR   Specific Gravity, Urine 1.021 1.005 - 1.030   pH 5.0 5.0 - 8.0   Glucose, UA NEGATIVE NEGATIVE mg/dL   Hgb urine dipstick NEGATIVE NEGATIVE   Bilirubin Urine NEGATIVE NEGATIVE   Ketones, ur 20 (A) NEGATIVE mg/dL   Protein, ur NEGATIVE NEGATIVE mg/dL   Nitrite NEGATIVE NEGATIVE   Leukocytes, UA NEGATIVE NEGATIVE   No results found.  MDM Category 1 tracing -- variable decel vs artificact vs MHR tracing -- BPP 8/8 with normal AFI & fetal tracing reviewed by Dr. Nehemiah Settle EKG -- reviewed by Dr. Nehemiah Settle Doppler venous study -- negative for DVT Occasional episodes of maternal tachycardia -- SpO2 >99% NAD, lung sounds clear Discussed patient with Dr. Nehemiah Settle. Ok to discharge home.   Assessment and Plan  A: 1. Swelling of lower extremity during pregnancy in third trimester   2. [redacted] weeks gestation of pregnancy   3. Antepartum variable deceleration    P: Discharge home Keep f/u with OB Discussed reasons to return to [facility] 12/05/2016, 5:28 PM

## 2016-12-05 NOTE — Progress Notes (Signed)
*  Preliminary Results* Left lower extremity venous duplex completed. Left lower extremity is negative for deep vein thrombosis. There is no evidence of left Baker's cyst.  12/05/2016 6:54 PM  Maudry Mayhew, BS, RVT, RDCS, RDMS

## 2016-12-05 NOTE — Discharge Instructions (Signed)

## 2016-12-05 NOTE — MAU Note (Signed)
Pt presents to MAU with complaints of pain that started in her left leg yesterday with swelling. Pt denies any vaginal bleeding or LOF

## 2016-12-13 ENCOUNTER — Inpatient Hospital Stay (HOSPITAL_COMMUNITY)
Admission: AD | Admit: 2016-12-13 | Discharge: 2016-12-14 | Disposition: A | Discharge status: Home / Self Care | Payer: BLUE CROSS/BLUE SHIELD | Source: Ambulatory Visit | Attending: Obstetrics and Gynecology | Admitting: Obstetrics and Gynecology

## 2016-12-13 DIAGNOSIS — Z3A39 39 weeks gestation of pregnancy: Secondary | ICD-10-CM | POA: Insufficient documentation

## 2016-12-13 DIAGNOSIS — O479 False labor, unspecified: Secondary | ICD-10-CM

## 2016-12-13 DIAGNOSIS — O471 False labor at or after 37 completed weeks of gestation: Secondary | ICD-10-CM | POA: Insufficient documentation

## 2016-12-13 NOTE — MAU Note (Signed)
Patient presents with complaints of ctx that have been ongoing all day but have increased in strength in the past 2 hours.  States she has timed them approximately 7 minutes apart.  No LOF or VB.  Reports good FM.  Last VE was 2 weeks ago- 0.5 cm dilated.

## 2016-12-14 DIAGNOSIS — Z3A39 39 weeks gestation of pregnancy: Secondary | ICD-10-CM | POA: Diagnosis not present

## 2016-12-14 DIAGNOSIS — O471 False labor at or after 37 completed weeks of gestation: Secondary | ICD-10-CM | POA: Diagnosis not present

## 2016-12-14 NOTE — Discharge Instructions (Signed)

## 2016-12-14 NOTE — MAU Note (Signed)
I have communicated with Dr. Cyndia Skeeters and reviewed vital signs:  Vitals:   12/13/16 2340  BP: 132/81  Pulse: 88  Resp: 18  Temp: 98.2 F (36.8 C)    Vaginal exam:  Dilation: 1.5 Effacement (%): 30, 40 Cervical Position: Posterior Station: -2, -3 Presentation: Vertex Exam by:: Leanor Rubenstein, RN,   Also reviewed contraction pattern and that non-stress test is reactive.  It has been documented that patient is contracting every 2-7 minutes with no cervical change over 1.5 hours not indicating active labor.  Patient denies any other complaints.  Based on this report provider has given order for discharge.  A discharge order and diagnosis entered by a provider.   Labor discharge instructions reviewed with patient.

## 2016-12-19 ENCOUNTER — Encounter (HOSPITAL_COMMUNITY): Payer: Self-pay | Admitting: *Deleted

## 2016-12-19 ENCOUNTER — Telehealth (HOSPITAL_COMMUNITY): Payer: Self-pay | Admitting: *Deleted

## 2016-12-19 NOTE — Telephone Encounter (Signed)
Preadmission screen  

## 2016-12-23 ENCOUNTER — Inpatient Hospital Stay (HOSPITAL_COMMUNITY): Payer: BLUE CROSS/BLUE SHIELD | Admitting: Anesthesiology

## 2016-12-23 ENCOUNTER — Inpatient Hospital Stay (HOSPITAL_COMMUNITY)
Admission: RE | Admit: 2016-12-23 | Discharge: 2016-12-25 | DRG: 775 | Disposition: A | Discharge status: Home / Self Care | Payer: BLUE CROSS/BLUE SHIELD | Source: Ambulatory Visit | Attending: Family Medicine | Admitting: Family Medicine

## 2016-12-23 ENCOUNTER — Encounter (HOSPITAL_COMMUNITY): Payer: Self-pay

## 2016-12-23 DIAGNOSIS — O99334 Smoking (tobacco) complicating childbirth: Secondary | ICD-10-CM | POA: Diagnosis present

## 2016-12-23 DIAGNOSIS — F17210 Nicotine dependence, cigarettes, uncomplicated: Secondary | ICD-10-CM | POA: Diagnosis present

## 2016-12-23 DIAGNOSIS — O34211 Maternal care for low transverse scar from previous cesarean delivery: Secondary | ICD-10-CM | POA: Diagnosis present

## 2016-12-23 DIAGNOSIS — O480 Post-term pregnancy: Principal | ICD-10-CM | POA: Diagnosis present

## 2016-12-23 DIAGNOSIS — Z3A41 41 weeks gestation of pregnancy: Secondary | ICD-10-CM | POA: Diagnosis not present

## 2016-12-23 LAB — CBC
HCT: 30.3 % — ABNORMAL LOW (ref 36.0–46.0)
Hemoglobin: 10 g/dL — ABNORMAL LOW (ref 12.0–15.0)
MCH: 29.2 pg (ref 26.0–34.0)
MCHC: 33 g/dL (ref 30.0–36.0)
MCV: 88.3 fL (ref 78.0–100.0)
PLATELETS: 294 10*3/uL (ref 150–400)
RBC: 3.43 MIL/uL — AB (ref 3.87–5.11)
RDW: 14.7 % (ref 11.5–15.5)
WBC: 10.5 10*3/uL (ref 4.0–10.5)

## 2016-12-23 LAB — TYPE AND SCREEN
ABO/RH(D): A POS
ANTIBODY SCREEN: NEGATIVE

## 2016-12-23 LAB — RPR: RPR Ser Ql: NONREACTIVE

## 2016-12-23 MED ORDER — PHENYLEPHRINE 40 MCG/ML (10ML) SYRINGE FOR IV PUSH (FOR BLOOD PRESSURE SUPPORT)
80.0000 ug | PREFILLED_SYRINGE | INTRAVENOUS | Status: DC | PRN
  Filled 2016-12-23: qty 5

## 2016-12-23 MED ORDER — LIDOCAINE HCL (PF) 1 % IJ SOLN
30.0000 mL | INTRAMUSCULAR | Status: DC | PRN
  Administered 2016-12-24: 30 mL via SUBCUTANEOUS
  Filled 2016-12-23: qty 30

## 2016-12-23 MED ORDER — FENTANYL 2.5 MCG/ML BUPIVACAINE 1/10 % EPIDURAL INFUSION (WH - ANES)
14.0000 mL/h | INTRAMUSCULAR | Status: DC | PRN
  Administered 2016-12-23 (×2): 14 mL/h via EPIDURAL
  Filled 2016-12-23 (×3): qty 100

## 2016-12-23 MED ORDER — OXYTOCIN 40 UNITS IN LACTATED RINGERS INFUSION - SIMPLE MED
1.0000 m[IU]/min | INTRAVENOUS | Status: DC
  Administered 2016-12-23 (×2): 2 m[IU]/min via INTRAVENOUS
  Filled 2016-12-23: qty 1000

## 2016-12-23 MED ORDER — FLEET ENEMA 7-19 GM/118ML RE ENEM: 1.0000 | ENEMA | RECTAL | Status: DC | PRN

## 2016-12-23 MED ORDER — EPHEDRINE 5 MG/ML INJ
10.0000 mg | INTRAVENOUS | Status: DC | PRN
  Filled 2016-12-23: qty 2

## 2016-12-23 MED ORDER — OXYTOCIN 40 UNITS IN LACTATED RINGERS INFUSION - SIMPLE MED: 2.5000 [IU]/h | INTRAVENOUS | Status: DC

## 2016-12-23 MED ORDER — OXYTOCIN BOLUS FROM INFUSION
500.0000 mL | Freq: Once | INTRAVENOUS | Status: AC
  Administered 2016-12-24: 500 mL via INTRAVENOUS

## 2016-12-23 MED ORDER — LIDOCAINE HCL (PF) 1 % IJ SOLN: INTRAMUSCULAR | Status: DC | PRN

## 2016-12-23 MED ORDER — LACTATED RINGERS IV SOLN
INTRAVENOUS | Status: DC
  Administered 2016-12-23 (×2): via INTRAVENOUS

## 2016-12-23 MED ORDER — LACTATED RINGERS IV SOLN
500.0000 mL | INTRAVENOUS | Status: DC | PRN
  Administered 2016-12-23: 500 mL via INTRAVENOUS

## 2016-12-23 MED ORDER — DIPHENHYDRAMINE HCL 50 MG/ML IJ SOLN: 12.5000 mg | INTRAMUSCULAR | Status: DC | PRN

## 2016-12-23 MED ORDER — LACTATED RINGERS IV SOLN
500.0000 mL | Freq: Once | INTRAVENOUS | Status: AC
  Administered 2016-12-23: 500 mL via INTRAVENOUS

## 2016-12-23 MED ORDER — LACTATED RINGERS IV SOLN
INTRAVENOUS | Status: AC
  Administered 2016-12-23: 21:00:00 via INTRAUTERINE

## 2016-12-23 MED ORDER — ACETAMINOPHEN 325 MG PO TABS: 650.0000 mg | ORAL_TABLET | ORAL | Status: DC | PRN

## 2016-12-23 MED ORDER — SOD CITRATE-CITRIC ACID 500-334 MG/5ML PO SOLN: 30.0000 mL | ORAL | Status: DC | PRN

## 2016-12-23 MED ORDER — TERBUTALINE SULFATE 1 MG/ML IJ SOLN
0.2500 mg | Freq: Once | INTRAMUSCULAR | Status: AC | PRN
  Administered 2016-12-23: 0.25 mg via SUBCUTANEOUS
  Filled 2016-12-23: qty 1

## 2016-12-23 MED ORDER — ONDANSETRON HCL 4 MG/2ML IJ SOLN: 4.0000 mg | Freq: Four times a day (QID) | INTRAMUSCULAR | Status: DC | PRN

## 2016-12-23 MED ORDER — FENTANYL CITRATE (PF) 100 MCG/2ML IJ SOLN: 100.0000 ug | INTRAMUSCULAR | Status: DC | PRN

## 2016-12-23 MED ORDER — PHENYLEPHRINE 40 MCG/ML (10ML) SYRINGE FOR IV PUSH (FOR BLOOD PRESSURE SUPPORT)
80.0000 ug | PREFILLED_SYRINGE | INTRAVENOUS | Status: DC | PRN
  Filled 2016-12-23: qty 5
  Filled 2016-12-23: qty 10

## 2016-12-23 NOTE — Anesthesia Pain Management Evaluation Note (Signed)
  CRNA Pain Management Visit Note  Patient: Katelyn Shaw, 31 y.o., female  "Hello I am a member of the anesthesia team at [facility]. We have an anesthesia team available at all times to provide care throughout the hospital, including epidural management and anesthesia for C-section. I don't know your plan for the delivery whether it a natural birth, water birth, IV sedation, nitrous supplementation, doula or epidural, but we want to meet your pain goals."   1.Was your pain managed to your expectations on prior hospitalizations?   Yes   2.What is your expectation for pain management during this hospitalization?     Epidural  3.How can we help you reach that goal? Epidural when ready.  Record the patient's initial score and the patient's pain goal.   Pain: 6  Pain Goal: 8 [facility] wants you to be able to say your pain was always managed very well.  Hades Mathew L 12/23/2016

## 2016-12-23 NOTE — H&P (Signed)
LABOR AND DELIVERY ADMISSION HISTORY AND PHYSICAL NOTE  Katelyn Shaw is a 31 y.o. female G2P1001 with IUP at [redacted]w[redacted]d presenting for IOL due to post dates pregnancy.   She reports positive fetal movement. She denies leakage of fluid or vaginal bleeding. Patient's previous delivery was via VBAC due to NRFHT and was emergent, baby was 6#?oz baby at term.   Prenatal History/Complications:   Previous CS    Past Medical History: Past Medical History:  Diagnosis Date  . Anemia   . Bacterial vaginosis    recurring  . Chlamydia   . Herpes    Type 1, lesion X 1  . Trichomonas     Past Surgical History: Past Surgical History:  Procedure Laterality Date  . CESAREAN SECTION    . cyst removed from R ear    . HERNIA REPAIR      Obstetrical History: OB History    Gravida Para Term Preterm AB Living   2 1 1  0 0 1   SAB TAB Ectopic Multiple Live Births   0 0 0 0 1      Social History: Social History   Social History  . Marital status: Single    Spouse name: N/A  . Number of children: N/A  . Years of education: N/A   Social History Main Topics  . Smoking status: Current Every Day Smoker    Packs/day: 0.00    Years: 6.00    Types: Cigarettes    Last attempt to quit: 11/18/2016  . Smokeless tobacco: Never Used     Comment: 2-3 cigarettes/day  . Alcohol use Yes     Comment: Occas. once per month  . Drug use: No  . Sexual activity: Yes    Birth control/ protection: None     Comment: Apr 16, 2014   Other Topics Concern  . None   Social History Narrative  . None    Family History: Family History  Problem Relation Age of Onset  . Hypertension Mother   . Asthma Father   . Anesthesia problems Neg Hx     Allergies: Allergies  Allergen Reactions  . Codeine Hives and Swelling    Prescriptions Prior to Admission  Medication Sig Dispense Refill Last Dose  . acetaminophen (TYLENOL) 500 MG tablet Take 1,000 mg by mouth 3 (three) times daily as needed for moderate pain  or headache.    Past Month at Unknown time  . Prenat w/o A Vit-FeFum-FePo-FA (CONCEPT OB) 130-92.4-1 MG CAPS Take 1 tablet by mouth daily. 30 capsule 12 12/05/2016 at Unknown time     Review of Systems   All systems reviewed and negative except as stated in HPI  Physical Exam Blood pressure 135/76, pulse 95, temperature 98.3 F (36.8 C), temperature source Oral, resp. rate 18, height 5\' 4"  (1.626 m), weight 222 lb (100.7 kg), last menstrual period 03/11/2016. General appearance: alert, cooperative, appears stated age, no distress and mildly obese Lungs: clear to auscultation bilaterally Heart: regular rate and rhythm Abdomen: soft, non-tender; bowel sounds normal Extremities: No calf swelling or tenderness Presentation: cephalic Fetal monitoring: 145 bpm, mod var, +accels, no decels Uterine activity: Occasional Dilation: 2 Effacement (%): 60 Station: -2 Exam by:: Dr. Vanetta Shawl   Prenatal labs: ABO, Rh: --/--/A POS (04/14 4193) Antibody: PENDING (04/14 0815) Rubella: !Error! IMMUNE RPR: Nonreactive (08/07 0000)  HBsAg: Negative (08/07 0000)  HIV: Non Reactive (08/07 0156)  GBS: Negative (03/15 0000)  1 hr Glucola: 112 Neg Genetic screening:  Normal  Anatomy US: Normal  Prenatal Transfer Tool  Maternal Diabetes: No Genetic Screening: Normal Maternal Ultrasounds/Referrals: Normal Fetal Ultrasounds or other Referrals:  None Maternal Substance Abuse:  Yes:  Type: Smoker Significant Maternal Medications:  None Significant Maternal Lab Results: Lab values include: Group B Strep negative  Results for orders placed or performed during the hospital encounter of 12/23/16 (from the past 24 hour(s))  CBC   Collection Time: 12/23/16  8:15 AM  Result Value Ref Range   WBC 10.5 4.0 - 10.5 K/uL   RBC 3.43 (L) 3.87 - 5.11 MIL/uL   Hemoglobin 10.0 (L) 12.0 - 15.0 g/dL   HCT 30.3 (L) 36.0 - 46.0 %   MCV 88.3 78.0 - 100.0 fL   MCH 29.2 26.0 - 34.0 pg   MCHC 33.0 30.0 - 36.0 g/dL   RDW  14.7 11.5 - 15.5 %   Platelets 294 150 - 400 K/uL  Type and screen [place]   Collection Time: 12/23/16  8:15 AM  Result Value Ref Range   ABO/RH(D) A POS    Antibody Screen PENDING    Sample Expiration 12/26/2016     Patient Active Problem List   Diagnosis Date Noted  . Post-dates pregnancy 12/23/2016    Assessment: Katelyn Shaw is a 31 y.o. G2P1001 at [redacted]w[redacted]d here for IOL 2/2 post dates  #Labor: IOL 2/2 post dates, TOLAC (previous CS was emergency 2/2 NRFHT), FB placed with ease #Pain: Epidural on request, IV pain meds prn #FWB: Cat I #ID:  GBS Neg #MOF:  breast #MOC: POP vs Nexplanon #Circ:  N/A - girl   Trial of Labor after Cesarean Consent  31 y.o. [id number] at [redacted]w[redacted]d with Estimated Date of Delivery: 12/16/16 was seen today in office to discuss trial of labor after cesarean section (TOLAC) versus elective repeat cesarean delivery (ERCD). The following risks were discussed with the patient.  Risk of uterine rupture at term is 0.78 percent with TOLAC and 0.22 percent with ERCD. 1 in 10 uterine ruptures will result in neonatal death or neurological injury. The benefits of a trial of labor after cesarean (TOLAC) resulting in a vaginal birth after cesarean (VBAC) include the following: shorter length of hospital stay and postpartum recovery (in most cases); fewer complications, such as postpartum fever, wound or uterine infection, thromboembolism (blood clots in the leg or lung), need for blood transfusion and fewer neonatal breathing problems. The risks of an attempted VBAC or TOLAC include the following: Risk of failed trial of labor after cesarean (TOLAC) without a vaginal birth after cesarean (VBAC) resulting in repeat cesarean delivery (RCD) in about 20 to 60 percent of women who attempt VBAC.  Risk of rupture of uterus resulting in an emergency cesarean delivery. The risk of uterine rupture may be related in part to the type of uterine incision made during  the first cesarean delivery. A previous transverse uterine incision has the lowest risk of rupture (0.2 to 1.5 percent risk). Vertical or T-shaped uterine incisions have a higher risk of uterine rupture (4 to 9 percent risk)The risk of fetal death is very low with both VBAC and elective repeat cesarean delivery (ERCD), but the likelihood of fetal death is higher with VBAC than with ERCD. Maternal death is very rare with either type of delivery. The risks of an elective repeat cesarean delivery (ERCD) were reviewed with the patient including but not limited to: 10/998 risk of uterine rupture which could have serious consequences, bleeding which may require transfusion; infection  which may require antibiotics; injury to bowel, bladder or other surrounding organs (bowel, bladder, ureters); injury to the fetus; need for additional procedures including hysterectomy in the event of a life-threatening hemorrhage; thromboembolic phenomenon; abnormal placentation; incisional problems; death and other postoperative or anesthesia complications.    These risks and benefits are summarized on the consent form, which was reviewed with the patient during the visit.  Consent is in the chart, signed on 10/10/16 (under OB Prenatal 10/28/16 on page [phone]).    Katherine Basset, DO OB Fellow [facility], [facility] 12/23/2016, 9:23 AM

## 2016-12-23 NOTE — Progress Notes (Signed)
Labor Progress Note Katelyn Shaw is a 31 y.o. G2P1001 at [redacted]w[redacted]d presented for PDIOL S: noted some late decels on monitor and went to evaluate patient. On arrival, she is contracting about every 2 minutes. She is on pitocin at 6 u which we discontinued. We repositioned patient and started on oxygen by nonrebreathing. We gave LR bolus and terbutaline with resolution in decels.   On cervical exam, she was complete at 0 station with rim anteriorly which was easily reduced by Dr. Vanetta Shawl. Baby's head position appears to be OP. Patient attempted to push but the uterine contraction spaced out after terbutaline. Given FHT is good and decels have resolved, decided to resume pitocin at 2x2 and hold off pushing for now.   O:  BP 120/66   Pulse 82   Temp 99.1 F (37.3 C) (Axillary)   Resp 20   Ht 5\' 4"  (1.626 m)   Wt 222 lb (100.7 kg)   LMP 03/11/2016   BMI 38.11 kg/m  EFM: 140/mod vari/some late decels that have resolved with the above measures  CVE: Dilation: 10 Dilation Complete Date: 12/23/16 Dilation Complete Time: 2304 Effacement (%): 100 Cervical Position: Posterior Station: +2 Presentation: Vertex Exam by:: Mumaw   A&P: 31 y.o. G2P1001 [redacted]w[redacted]d PDIOL #Labor: complete. Resume pitocin for good contraction.  IUPC and FHE in place. #Pain: epidural #FWB: CAT-1 now. Decels resolved.  #GBS: neg  Mercy Riding, MD 11:34 PM

## 2016-12-23 NOTE — Anesthesia Procedure Notes (Signed)
Epidural Patient location during procedure: OB Start time: 12/23/2016 3:45 PM  Staffing Anesthesiologist: Clydell Hakim Performed: anesthesiologist   Preanesthetic Checklist Completed: patient identified, site marked, surgical consent, pre-op evaluation, timeout performed, IV checked, risks and benefits discussed and monitors and equipment checked  Epidural Patient position: sitting Prep: DuraPrep Patient monitoring: heart rate, continuous pulse ox and blood pressure Approach: midline Location: L3-L4 Injection technique: LOR air and LOR saline  Needle:  Needle type: Tuohy  Needle gauge: 18 G Needle length: 9 cm Needle insertion depth: 9 cm Catheter type: closed end flexible Catheter size: 20 Guage Catheter at skin depth: 15 cm Test dose: negative and 2% lidocaine with Epi 1:200 K  Assessment Sensory level: T9  Additional Notes I was present and participated in the regional anesthetic procedure key events.  By signing, I attest that I have identified and re-evaluated the patient immediately before the induction of anesthesia and I am satisfied that my anesthetic plan is suitable for the patient's condition and procedure.Reason for block:procedure for pain

## 2016-12-23 NOTE — Anesthesia Preprocedure Evaluation (Addendum)
Anesthesia Evaluation  Patient identified by MRN, date of birth, ID band Patient awake    Reviewed: Allergy & Precautions, H&P , Patient's Chart, lab work & pertinent test results  History of Anesthesia Complications Negative for: history of anesthetic complications  Airway Mallampati: I  TM Distance: >3 FB Neck ROM: Full    Dental no notable dental hx.    Pulmonary neg pneumonia , neg COPD, neg recent URI, Current Smoker,    Pulmonary exam normal        Cardiovascular negative cardio ROS Normal cardiovascular exam     Neuro/Psych    GI/Hepatic negative GI ROS, Neg liver ROS,   Endo/Other  negative endocrine ROS  Renal/GU      Musculoskeletal negative musculoskeletal ROS (+)   Abdominal   Peds negative pediatric ROS (+)  Hematology   Anesthesia Other Findings   Reproductive/Obstetrics (+) Pregnancy                            Anesthesia Physical Anesthesia Plan  ASA: II  Anesthesia Plan: Epidural   Post-op Pain Management:    Induction:   Airway Management Planned:   Additional Equipment:   Intra-op Plan:   Post-operative Plan:   Informed Consent: I have reviewed the patients History and Physical, chart, labs and discussed the procedure including the risks, benefits and alternatives for the proposed anesthesia with the patient or authorized representative who has indicated his/her understanding and acceptance.     Plan Discussed with:   Anesthesia Plan Comments:         Anesthesia Quick Evaluation

## 2016-12-23 NOTE — Progress Notes (Signed)
Labor Progress Note Katelyn Shaw is a 32 y.o. G2P1001 at [redacted]w[redacted]d presented for PDIOL S: no complaints now.   O:  BP 131/74   Pulse 79   Temp 98.7 F (37.1 C) (Axillary)   Resp 20   Ht 5\' 4"  (1.626 m)   Wt 222 lb (100.7 kg)   LMP 03/11/2016   BMI 38.11 kg/m  EFM: 140/mod var/varia decels with one prolonged decel after IUPC  CVE: Dilation: 6 Effacement (%): 80, 90 Cervical Position: Posterior Station: -1 Presentation: Vertex Exam by:: Dr. Vanetta Shawl   A&P: 31 y.o. G2P1001 [redacted]w[redacted]d for PDIOL #Labor: cut down on pit to 6. IUPC and FHE placed. Amnioinfusion.  #Pain: epidural #FWB: CAT-2. Had variable decels followed by one prolonged decel after IUPC that has resolved. Good variability #GBS: neg  Mercy Riding, MD 9:08 PM

## 2016-12-24 ENCOUNTER — Encounter (HOSPITAL_COMMUNITY): Payer: Self-pay

## 2016-12-24 DIAGNOSIS — O34211 Maternal care for low transverse scar from previous cesarean delivery: Secondary | ICD-10-CM

## 2016-12-24 DIAGNOSIS — Z3A41 41 weeks gestation of pregnancy: Secondary | ICD-10-CM

## 2016-12-24 DIAGNOSIS — O480 Post-term pregnancy: Secondary | ICD-10-CM

## 2016-12-24 MED ORDER — HYDROMORPHONE HCL 2 MG PO TABS
2.0000 mg | ORAL_TABLET | ORAL | Status: DC | PRN
  Administered 2016-12-24: 2 mg via ORAL
  Filled 2016-12-24: qty 1

## 2016-12-24 MED ORDER — DIBUCAINE 1 % RE OINT: 1.0000 "application " | TOPICAL_OINTMENT | RECTAL | Status: DC | PRN

## 2016-12-24 MED ORDER — SODIUM CHLORIDE 0.9% FLUSH: 3.0000 mL | INTRAVENOUS | Status: DC | PRN

## 2016-12-24 MED ORDER — DIPHENHYDRAMINE HCL 25 MG PO CAPS: 25.0000 mg | ORAL_CAPSULE | Freq: Four times a day (QID) | ORAL | Status: DC | PRN

## 2016-12-24 MED ORDER — IBUPROFEN 600 MG PO TABS
600.0000 mg | ORAL_TABLET | Freq: Four times a day (QID) | ORAL | Status: DC
  Administered 2016-12-24 – 2016-12-25 (×6): 600 mg via ORAL
  Filled 2016-12-24 (×6): qty 1

## 2016-12-24 MED ORDER — WITCH HAZEL-GLYCERIN EX PADS: 1.0000 "application " | MEDICATED_PAD | CUTANEOUS | Status: DC | PRN

## 2016-12-24 MED ORDER — OXYTOCIN 40 UNITS IN LACTATED RINGERS INFUSION - SIMPLE MED: 2.5000 [IU]/h | INTRAVENOUS | Status: DC | PRN

## 2016-12-24 MED ORDER — COCONUT OIL OIL: 1.0000 "application " | TOPICAL_OIL | Status: DC | PRN

## 2016-12-24 MED ORDER — SENNOSIDES-DOCUSATE SODIUM 8.6-50 MG PO TABS
2.0000 | ORAL_TABLET | ORAL | Status: DC
  Administered 2016-12-24: 2 via ORAL
  Filled 2016-12-24: qty 2

## 2016-12-24 MED ORDER — PRENATAL MULTIVITAMIN CH
1.0000 | ORAL_TABLET | Freq: Every day | ORAL | Status: DC
  Administered 2016-12-24 – 2016-12-25 (×2): 1 via ORAL
  Filled 2016-12-24 (×2): qty 1

## 2016-12-24 MED ORDER — BENZOCAINE-MENTHOL 20-0.5 % EX AERO
1.0000 "application " | INHALATION_SPRAY | CUTANEOUS | Status: DC | PRN
  Administered 2016-12-24: 1 via TOPICAL
  Filled 2016-12-24: qty 56

## 2016-12-24 MED ORDER — SODIUM CHLORIDE 0.9 % IV SOLN: 250.0000 mL | INTRAVENOUS | Status: DC | PRN

## 2016-12-24 MED ORDER — SODIUM CHLORIDE 0.9% FLUSH: 3.0000 mL | Freq: Two times a day (BID) | INTRAVENOUS | Status: DC

## 2016-12-24 MED ORDER — TETANUS-DIPHTH-ACELL PERTUSSIS 5-2.5-18.5 LF-MCG/0.5 IM SUSP: 0.5000 mL | Freq: Once | INTRAMUSCULAR | Status: DC

## 2016-12-24 MED ORDER — SIMETHICONE 80 MG PO CHEW: 80.0000 mg | CHEWABLE_TABLET | ORAL | Status: DC | PRN

## 2016-12-24 MED ORDER — ACETAMINOPHEN 325 MG PO TABS
650.0000 mg | ORAL_TABLET | ORAL | Status: DC | PRN
  Administered 2016-12-24 – 2016-12-25 (×4): 650 mg via ORAL
  Filled 2016-12-24 (×4): qty 2

## 2016-12-24 MED ORDER — ONDANSETRON HCL 4 MG PO TABS: 4.0000 mg | ORAL_TABLET | ORAL | Status: DC | PRN

## 2016-12-24 MED ORDER — ONDANSETRON HCL 4 MG/2ML IJ SOLN: 4.0000 mg | INTRAMUSCULAR | Status: DC | PRN

## 2016-12-24 MED ORDER — ZOLPIDEM TARTRATE 5 MG PO TABS: 5.0000 mg | ORAL_TABLET | Freq: Every evening | ORAL | Status: DC | PRN

## 2016-12-24 NOTE — Progress Notes (Signed)
Pt c/o Abdominal pain 7/10. Pt distended and states an urge to void. Pt up to void but could not void. Bladder scanned with result of >930ml. I/O cath (By [facility] RN from L/D) with result of 51ml. Pt states relief of pain from a 7 to a 5.

## 2016-12-24 NOTE — Lactation Note (Signed)
This note was copied from a baby's chart. Lactation Consultation Note  P2, Baby 68 hours old and room full of family. Mother states she has hand expressed drops and baby has latched for almost 25 min this morning. Suggest she place baby STS to interest her in feeding. Mom encouraged to feed baby 8-12 times/24 hours and with feeding cues.  Mother concerned about volume.  Described how breastmilk comes to volume and supply and demand. Suggest she call for assistance w/ next feeding. Mom made aware of O/P services, breastfeeding support groups, community resources, and our phone # for post-discharge questions.    Patient Name: Katelyn Shaw [id number] Date: 12/24/2016 Reason for consult: Initial assessment   Maternal Data Has patient been taught Hand Expression?: Yes Does the patient have breastfeeding experience prior to this delivery?: Yes  Feeding Feeding Type: Breast Fed Length of feed: 30 min  LATCH Score/Interventions                      Lactation Tools Discussed/Used     Consult Status Consult Status: Follow-up Date: 12/25/16 Follow-up type: In-patient    Katelyn Shaw [facility] 12/24/2016, 11:37 AM

## 2016-12-24 NOTE — Anesthesia Postprocedure Evaluation (Signed)
Anesthesia Post Note  Patient: Katelyn Shaw  Procedure(s) Performed: * No procedures listed *  Patient location during evaluation: Mother Baby Anesthesia Type: Epidural Level of consciousness: awake and alert, oriented and patient cooperative Pain management: pain level controlled Vital Signs Assessment: post-procedure vital signs reviewed and stable Respiratory status: spontaneous breathing Cardiovascular status: stable Postop Assessment: no headache, epidural receding, patient able to bend at knees and no signs of nausea or vomiting Anesthetic complications: no Comments: Pt walking unassisted.  States pain score is 6 but manageable.  Pt prefers not to take much pain med.  Encouraged to request pain meds as needed.        Last Vitals:  Vitals:   12/24/16 0520 12/24/16 0850  BP: 130/70 119/65  Pulse: 85 79  Resp: 20   Temp: 37.3 C 37.4 C    Last Pain:  Vitals:   12/24/16 0850  TempSrc: Oral  PainSc: 4    Pain Goal:                 [facility]

## 2016-12-25 MED ORDER — IBUPROFEN 600 MG PO TABS: 600.0000 mg | ORAL_TABLET | Freq: Four times a day (QID) | ORAL | 0 refills | Status: DC

## 2016-12-25 NOTE — Discharge Summary (Signed)
OB Discharge Summary  Patient Name: Katelyn Shaw DOB: 07-Nov-1985 MRN: [id number]  Date of admission: 12/23/2016 Delivering MD: Donnamae Jude   Date of discharge: 12/25/2016  Admitting diagnosis: 41wks induction  Intrauterine pregnancy: [redacted]w[redacted]d     Secondary diagnosis:Active Problems:   Post-dates pregnancy  Additional problems:none     Discharge diagnosis: Term Pregnancy Delivered                                                                     Post partum procedures:n/a  Augmentation: n/a  Complications: None  Hospital course:  Onset of Labor With Vaginal Delivery     31 y.o. yo [id number] at [redacted]w[redacted]d was admitted in Latent Labor on 12/23/2016. Patient had an uncomplicated labor course as follows:  Membrane Rupture Time/Date: 6:56 PM ,12/23/2016   Intrapartum Procedures: Episiotomy: None [1]                                         Lacerations:  2nd degree [3];Perineal [11]  Patient had a delivery of a Viable infant. 12/24/2016  Information for the patient's newborn:  Lyvia, Mondesir [[id number]]  Delivery Method: VBAC, Spontaneous (Filed from Delivery Summary)    Pateint had an uncomplicated postpartum course.  She is ambulating, tolerating a regular diet, passing flatus, and urinating well. Patient is discharged home in stable condition on 12/25/16.   Physical exam  Vitals:   12/24/16 0520 12/24/16 0850 12/24/16 1555 12/25/16 0602  BP: 130/70 119/65 124/69 126/70  Pulse: 85 79 83 98  Resp: 20  18 18   Temp: 99.2 F (37.3 C) 99.3 F (37.4 C) 98.4 F (36.9 C) 98.5 F (36.9 C)  TempSrc: Oral Oral Oral Oral  Weight:      Height:       General: alert, cooperative and no distress Lochia: appropriate Uterine Fundus: firm Incision: N/A DVT Evaluation: No evidence of DVT seen on physical exam. Labs: Lab Results  Component Value Date   WBC 10.5 12/23/2016   HGB 10.0 (L) 12/23/2016   HCT 30.3 (L) 12/23/2016   MCV 88.3 12/23/2016   PLT 294 12/23/2016   CMP  Latest Ref Rng & Units 09/01/2016  Glucose 65 - 99 mg/dL 93  BUN 6 - 20 mg/dL 9  Creatinine 0.44 - 1.00 mg/dL 0.59  Sodium 135 - 145 mmol/L 136  Potassium 3.5 - 5.1 mmol/L 3.6  Chloride 101 - 111 mmol/L 103  CO2 22 - 32 mmol/L 25  Calcium 8.9 - 10.3 mg/dL 9.2  Total Protein 6.5 - 8.1 g/dL 7.5  Total Bilirubin 0.3 - 1.2 mg/dL 0.4  Alkaline Phos 38 - 126 U/L 44  AST 15 - 41 U/L 20  ALT 14 - 54 U/L 16    Discharge instruction: per After Visit Summary and "Baby and Me Booklet".  After Visit Meds:  Allergies as of 12/25/2016      Reactions   Codeine Hives, Swelling      Medication List    STOP taking these medications   acetaminophen 500 MG tablet Commonly known as:  TYLENOL     TAKE these medications  CONCEPT OB 130-92.4-1 MG Caps Take 1 tablet by mouth daily.   ibuprofen 600 MG tablet Commonly known as:  ADVIL,MOTRIN Take 1 tablet (600 mg total) by mouth every 6 (six) hours.       Diet: routine diet  Activity: Advance as tolerated. Pelvic rest for 6 weeks.   Outpatient follow up:6 weeks Follow up Appt:No future appointments. Follow up visit: No Follow-up on file.  Postpartum contraception: Nexplanon  Newborn Data: Live born female  Birth Weight: 8 lb 12.2 oz (3975 g) APGAR: 4, 6  Baby Feeding: Breast Disposition:home with mother   12/25/2016 Koren Shiver, CNM

## 2016-12-25 NOTE — Lactation Note (Signed)
This note was copied from a baby's chart. Lactation Consultation Note  Patient Name: Katelyn Shaw [id number] Date: 12/25/2016 Reason for consult: Follow-up assessment   Follow up assessment with mom of 4 hour old infant. Infant with 10 BF for 10-45 minutes, 2 voids and 1 stool (MSF and 3 stools in life) in 24 hours preceding this assessment. Infant weight 8 lb 10 oz with 2 % weight loss since birth. LATCH scores 6-8.  Mom reports infant was cluster feeding this am. She reports she is having some nipple soreness at beginning and during feeding. Nipple care reviewed of EBM and coconut or olive oil. We awakened infant and put her to breast. Latched infant to right breast in the cross cradle hold. Infant latched after a few tries. She was noted to have her upper lip curled under, showed mom how to flange upper lip and mom reports latch felt better. Infant sleepy at breast, enc mom to use awakening techniques with feeding to encourage active feeding, Mom reports this is the way the infant has been feeding. Infant responded well to simulation and breast compression. Swallows increased with breast compression.   Enc mom to continue to feed infant STS 8-12 x in 24 hours using both breast with each feeding. Enc mom to use breast compression/massage and awakening techniques with feeding. I/O, Engorgement prevention/treatment, and Breast milk handling and storage reviewed. Mom has a Spectra 2 pump at home. Mom is a [facility] client and is aware to call and make appt post d/c.   Mom without further questions/concerns at this time. [facility] Brochure reviewed, mom aware of OP services, BF Support Groups and [place] phone #. Enc mom to call with questions/concerns.    Maternal Data Formula Feeding for Exclusion: No Has patient been taught Hand Expression?: Yes Does the patient have breastfeeding experience prior to this delivery?: Yes  Feeding Feeding Type: Breast Fed Length of feed: 10 min (still feeding when LC left  room)  LATCH Score/Interventions Latch: Repeated attempts needed to sustain latch, nipple held in mouth throughout feeding, stimulation needed to elicit sucking reflex. Intervention(s): Adjust position;Assist with latch;Breast massage;Breast compression  Audible Swallowing: A few with stimulation Intervention(s): Skin to skin;Hand expression;Alternate breast massage  Type of Nipple: Everted at rest and after stimulation  Comfort (Breast/Nipple): Filling, red/small blisters or bruises, mild/mod discomfort  Problem noted: Mild/Moderate discomfort Interventions  (Cracked/bleeding/bruising/blister): Expressed breast milk to nipple Interventions (Mild/moderate discomfort): Hand massage  Hold (Positioning): Assistance needed to correctly position infant at breast and maintain latch. Intervention(s): Breastfeeding basics reviewed;Support Pillows;Position options;Skin to skin  LATCH Score: 6  Lactation Tools Discussed/Used WIC Program: Yes Pump Review: Milk Storage   Consult Status Consult Status: Complete Follow-up type: Call as needed    Donn Pierini 12/25/2016, 11:35 AM

## 2017-05-29 IMAGING — US US MFM FETAL BPP W/O NON-STRESS
1 series · 12 of 15 positions shown · non-contrast
Comparison: none

[Series 1: us mfm fetal bpp w/o non-stress · 15 acquisitions, 12 frames shown]
[im 1/15]
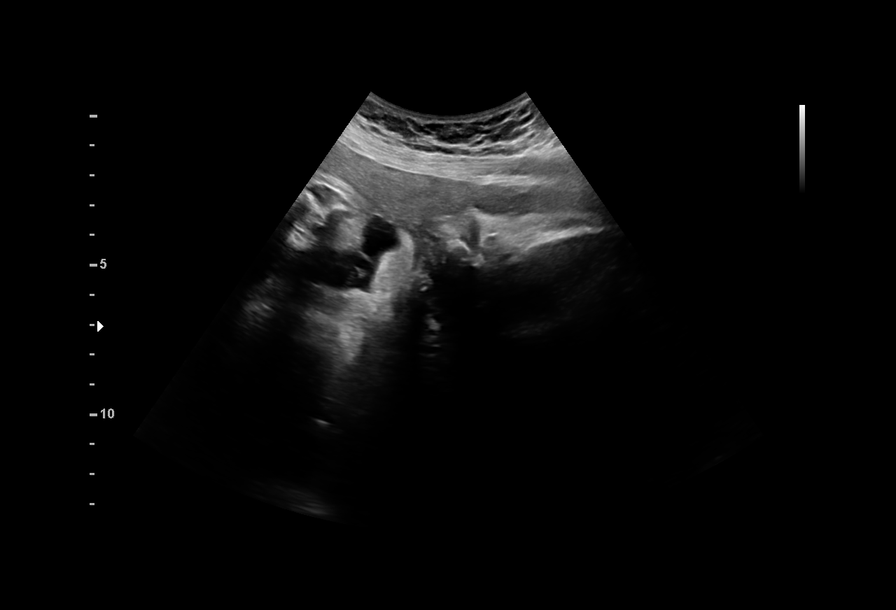
[im 2/15]
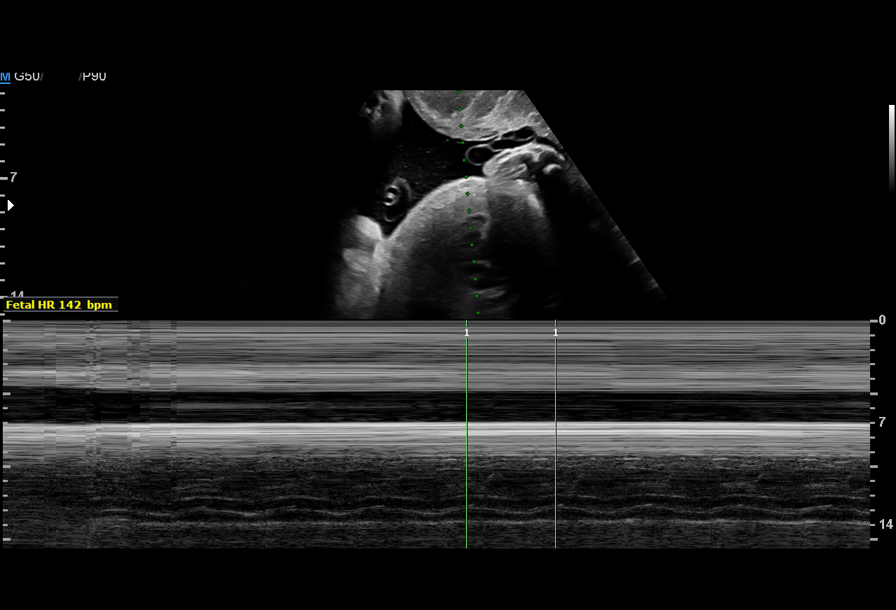
[im 4/15]
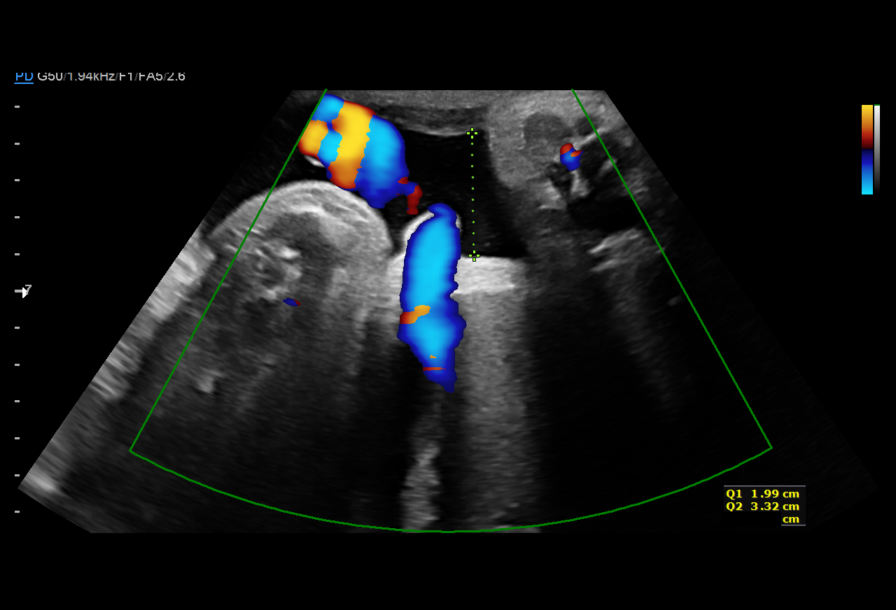
[im 5/15]
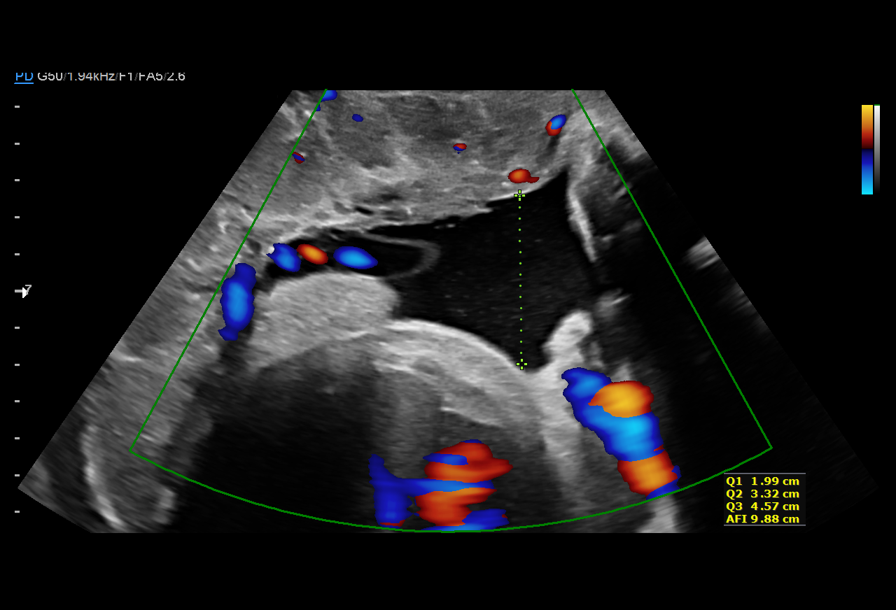
[im 6/15]
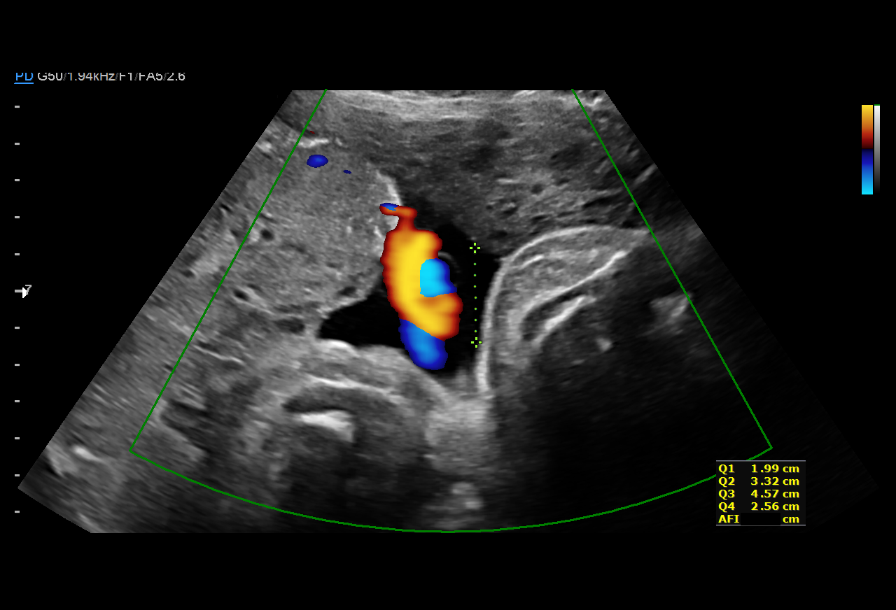
[im 7/15]
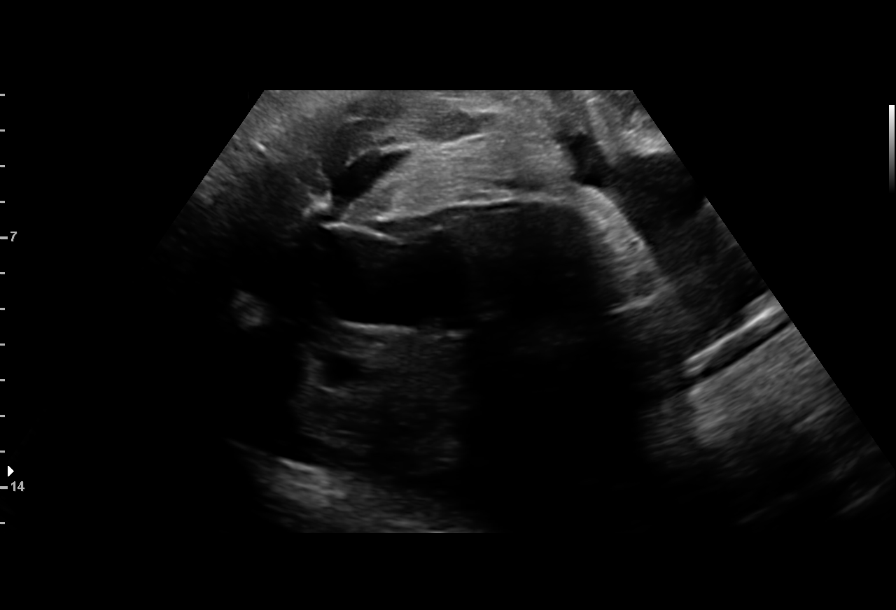
[im 9/15]
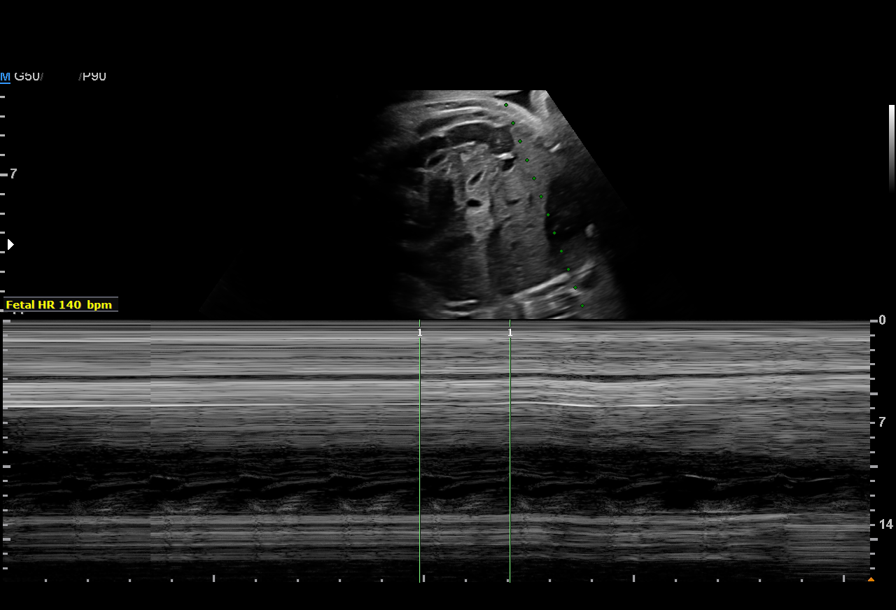
[im 10/15]
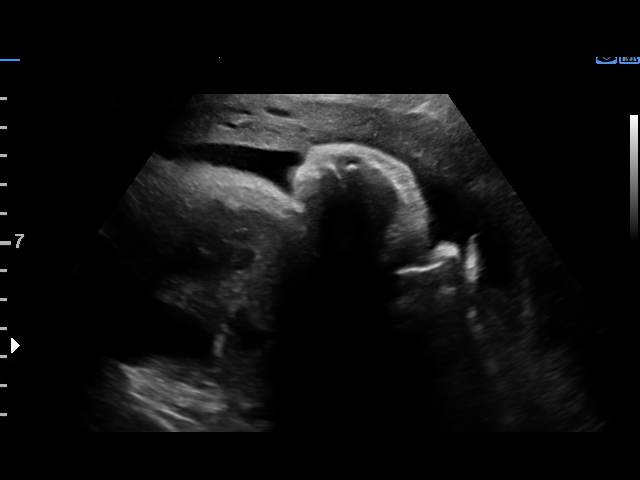
[im 11/15]
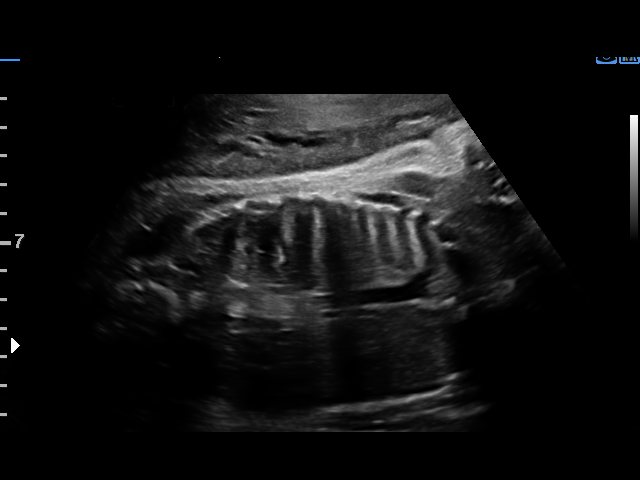
[im 12/15]
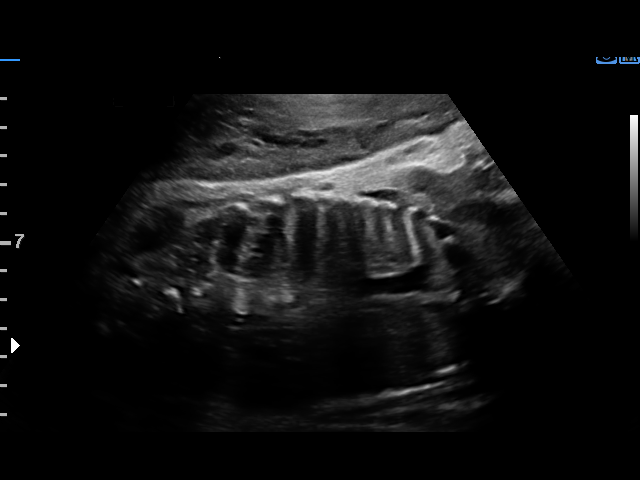
[im 14/15]
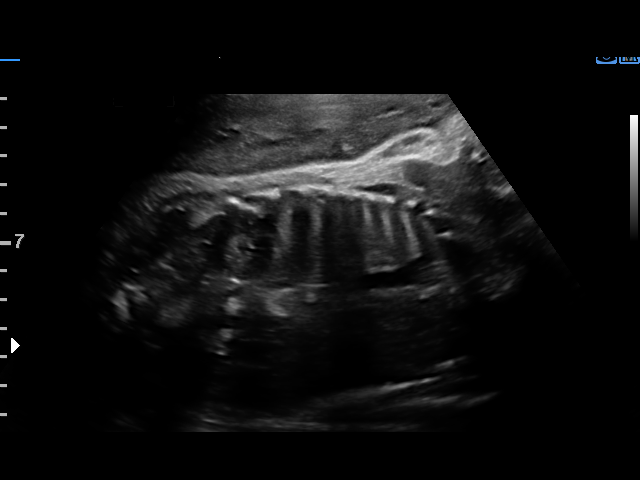
[im 15/15]
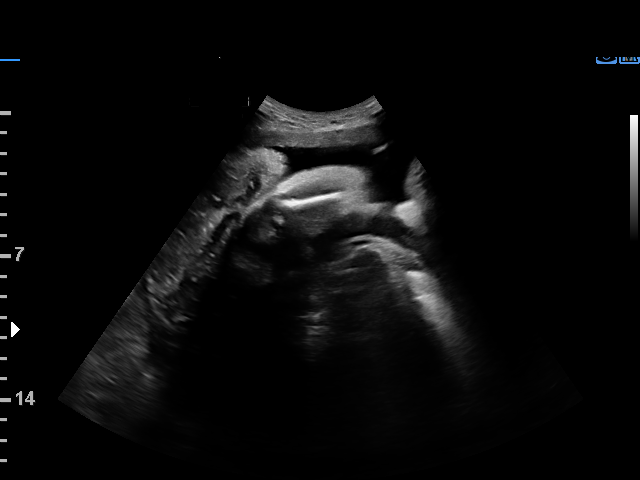

[12 of 15 positions shown; findings below may reference images not displayed]

MAU/Triage

Indications

38 weeks gestation of pregnancy
Non-reactive NST, FHR decelerations
OB History

Gravidity:    2         Term:   1        Prem:   0        SAB:   0
TOP:          0       Ectopic:  0        Living: 1
Fetal Evaluation

Num Of Fetuses:     1
Fetal Heart         142
Rate(bpm):
Cardiac Activity:   Observed
Presentation:       Cephalic

Amniotic Fluid
AFI FV:      Subjectively within normal limits

AFI Sum(cm)     %Tile       Largest Pocket(cm)
12.44           45

RUQ(cm)       RLQ(cm)       LUQ(cm)        LLQ(cm)
1.99

Comment:    [DATE] BPP in 13 mins
Biophysical Evaluation

Amniotic F.V:   Within normal limits       F. Tone:        Observed
F. Movement:    Observed                   Score:          [DATE]
F. Breathing:   Observed
Gestational Age

LMP:           38w 2d       Date:   03/12/16                 EDD:   12/17/16
Best:          38w 2d    Det. By:   LMP  (03/12/16)          EDD:   12/17/16
Impression

IUP at 38+2 weeks with nonreactive NST and FHR
deceleration
Normal fetal movement and cardiac activity
Normal amniotic fluid
BPP [DATE]
Recommendations

Continue clinical evaluation and management

## 2017-09-19 ENCOUNTER — Encounter (HOSPITAL_COMMUNITY): Payer: Self-pay

## 2017-09-19 ENCOUNTER — Emergency Department (HOSPITAL_COMMUNITY)
Admission: EM | Admit: 2017-09-19 | Discharge: 2017-09-19 | Disposition: A | Discharge status: Home / Self Care | Payer: BLUE CROSS/BLUE SHIELD | Attending: Emergency Medicine | Admitting: Emergency Medicine

## 2017-09-19 ENCOUNTER — Other Ambulatory Visit: Payer: Self-pay

## 2017-09-19 DIAGNOSIS — R070 Pain in throat: Secondary | ICD-10-CM | POA: Diagnosis present

## 2017-09-19 DIAGNOSIS — Z79899 Other long term (current) drug therapy: Secondary | ICD-10-CM | POA: Insufficient documentation

## 2017-09-19 DIAGNOSIS — F17210 Nicotine dependence, cigarettes, uncomplicated: Secondary | ICD-10-CM | POA: Insufficient documentation

## 2017-09-19 DIAGNOSIS — J039 Acute tonsillitis, unspecified: Secondary | ICD-10-CM | POA: Insufficient documentation

## 2017-09-19 LAB — RAPID STREP SCREEN (MED CTR MEBANE ONLY): STREPTOCOCCUS, GROUP A SCREEN (DIRECT): NEGATIVE

## 2017-09-19 MED ORDER — AMOXICILLIN 500 MG PO CAPS: 1000.0000 mg | ORAL_CAPSULE | Freq: Two times a day (BID) | ORAL | 0 refills | Status: DC

## 2017-09-19 MED ORDER — IBUPROFEN 200 MG PO TABS
600.0000 mg | ORAL_TABLET | Freq: Once | ORAL | Status: AC
  Administered 2017-09-19: 600 mg via ORAL
  Filled 2017-09-19: qty 3

## 2017-09-19 MED ORDER — AMOXICILLIN 500 MG PO CAPS
1000.0000 mg | ORAL_CAPSULE | Freq: Once | ORAL | Status: AC
  Administered 2017-09-19: 1000 mg via ORAL
  Filled 2017-09-19: qty 2

## 2017-09-19 NOTE — ED Provider Notes (Signed)
[place] DEPT Provider Note   CSN: [id number] Arrival date & time: 09/19/17  0141     History   Chief Complaint Chief Complaint  Patient presents with  . Sore Throat    HPI Katelyn Shaw is a 32 y.o. female.  Patient presents with sore throat without other symptoms. No fever, congestion, cough. No nausea, vomiting or diarrhea. She has had strep in the past that presented the same way. No rash, headache.   The history is provided by the patient. No language interpreter was used.  Sore Throat  Pertinent negatives include no chest pain and no headaches.    Past Medical History:  Diagnosis Date  . Anemia   . Bacterial vaginosis    recurring  . Chlamydia   . Herpes    Type 1, lesion X 1  . Trichomonas     Patient Active Problem List   Diagnosis Date Noted  . Post-dates pregnancy 12/23/2016    Past Surgical History:  Procedure Laterality Date  . CESAREAN SECTION    . cyst removed from R ear    . HERNIA REPAIR      OB History    Gravida Para Term Preterm AB Living   2 2 2  0 0 2   SAB TAB Ectopic Multiple Live Births   0 0 0 0 2       Home Medications    Prior to Admission medications   Medication Sig Start Date End Date Taking? Authorizing Provider  ibuprofen (ADVIL,MOTRIN) 600 MG tablet Take 1 tablet (600 mg total) by mouth every 6 (six) hours. 12/25/16   Keitha Butte, CNM  Prenat w/o A Vit-FeFum-FePo-FA (CONCEPT OB) 130-92.4-1 MG CAPS Take 1 tablet by mouth daily. 04/17/16   Manya Silvas, CNM    Family History Family History  Problem Relation Age of Onset  . Hypertension Mother   . Asthma Father   . Anesthesia problems Neg Hx     Social History Social History   Tobacco Use  . Smoking status: Current Every Day Smoker    Packs/day: 0.00    Years: 6.00    Pack years: 0.00    Types: Cigarettes    Last attempt to quit: 11/18/2016    Years since quitting: 0.8  . Smokeless tobacco: Never Used  . Tobacco  comment: 2-3 cigarettes/day  Substance Use Topics  . Alcohol use: Yes    Comment: Occas. once per month  . Drug use: No     Allergies   Codeine   Review of Systems Review of Systems  Constitutional: Negative for fever.  HENT: Positive for sore throat and trouble swallowing (due to pain). Negative for congestion.   Respiratory: Negative for cough.   Cardiovascular: Negative for chest pain.  Gastrointestinal: Negative for nausea and vomiting.  Musculoskeletal: Negative for myalgias.  Skin: Negative for rash.  Neurological: Negative for headaches.     Physical Exam Updated Vital Signs BP (!) 142/95 (BP Location: Right Arm)   Pulse 89   Temp 98.1 F (36.7 C) (Oral)   Resp 17   SpO2 100%   Physical Exam  Constitutional: She appears well-developed and well-nourished.  HENT:  Head: Normocephalic.  Mouth/Throat: Mucous membranes are not dry. Posterior oropharyngeal edema and posterior oropharyngeal erythema present. Tonsillar exudate.  Neck: Normal range of motion. Neck supple.  Cardiovascular: Normal rate and regular rhythm.  Pulmonary/Chest: Effort normal and breath sounds normal.  Abdominal: Soft. Bowel sounds are normal. There is no  tenderness. There is no rebound and no guarding.  Musculoskeletal: Normal range of motion.  Neurological: She is alert. No cranial nerve deficit.  Skin: Skin is warm and dry. No rash noted.  Psychiatric: She has a normal mood and affect.     ED Treatments / Results  Labs (all labs ordered are listed, but only abnormal results are displayed) Labs Reviewed  RAPID STREP SCREEN (NOT AT [facility])  CULTURE, GROUP A STREP [facility])    EKG  EKG Interpretation None       Radiology No results found.  Procedures Procedures (including critical care time)  Medications Ordered in ED Medications  amoxicillin (AMOXIL) capsule 1,000 mg (1,000 mg Oral Given 09/19/17 0439)  ibuprofen (ADVIL,MOTRIN) tablet 600 mg (600 mg Oral Given 09/19/17 0439)      Initial Impression / Assessment and Plan / ED Course  I have reviewed the triage vital signs and the nursing notes.  Pertinent labs & imaging results that were available during my care of the patient were reviewed by me and considered in my medical decision making (see chart for details).     Patient presents with isolated sore throat without fever x 2 days. Strep is negative, however, she has bilateral tonsillar swelling and exudates without visualized abscess. Will treat with Amoxil x 10 days.   Final Clinical Impressions(s) / ED Diagnoses   Final diagnoses:  None   1. Tonsillitis   ED Discharge Orders    None       Charlann Lange, PA-C 09/19/17 1694    Veryl Speak, MD 09/19/17 [phone]

## 2017-09-19 NOTE — ED Triage Notes (Signed)
Pt presents w/ 7/10 sore throat pain. Pt has hx of strep throat.

## 2017-09-21 LAB — CULTURE, GROUP A STREP (THRC)

## 2018-05-28 ENCOUNTER — Encounter: Payer: Self-pay | Admitting: Emergency Medicine

## 2018-05-28 DIAGNOSIS — N764 Abscess of vulva: Secondary | ICD-10-CM | POA: Insufficient documentation

## 2018-05-28 DIAGNOSIS — F17210 Nicotine dependence, cigarettes, uncomplicated: Secondary | ICD-10-CM | POA: Insufficient documentation

## 2018-05-28 DIAGNOSIS — Z23 Encounter for immunization: Secondary | ICD-10-CM | POA: Insufficient documentation

## 2018-05-28 NOTE — ED Triage Notes (Signed)
Pt complains of a vaginal boil for two days, she's been applying warm compresses but no drainage.

## 2018-05-29 ENCOUNTER — Emergency Department (HOSPITAL_COMMUNITY)
Admission: EM | Admit: 2018-05-29 | Discharge: 2018-05-29 | Disposition: A | Discharge status: Home / Self Care | Payer: BLUE CROSS/BLUE SHIELD | Attending: Emergency Medicine | Admitting: Emergency Medicine

## 2018-05-29 DIAGNOSIS — L0291 Cutaneous abscess, unspecified: Secondary | ICD-10-CM

## 2018-05-29 MED ORDER — SULFAMETHOXAZOLE-TRIMETHOPRIM 800-160 MG PO TABS
1.0000 | ORAL_TABLET | Freq: Once | ORAL | Status: AC
  Administered 2018-05-29: 1 via ORAL
  Filled 2018-05-29: qty 1

## 2018-05-29 MED ORDER — LIDOCAINE-EPINEPHRINE-TETRACAINE (LET) SOLUTION
3.0000 mL | Freq: Once | NASAL | Status: AC
  Administered 2018-05-29: 03:00:00 3 mL via TOPICAL
  Filled 2018-05-29: qty 3

## 2018-05-29 MED ORDER — TETANUS-DIPHTH-ACELL PERTUSSIS 5-2.5-18.5 LF-MCG/0.5 IM SUSP
0.5000 mL | Freq: Once | INTRAMUSCULAR | Status: AC
  Administered 2018-05-29: 0.5 mL via INTRAMUSCULAR
  Filled 2018-05-29: qty 0.5

## 2018-05-29 MED ORDER — CEPHALEXIN 500 MG PO CAPS
500.0000 mg | ORAL_CAPSULE | Freq: Once | ORAL | Status: AC
  Administered 2018-05-29: 500 mg via ORAL
  Filled 2018-05-29: qty 1

## 2018-05-29 MED ORDER — CEPHALEXIN 500 MG PO CAPS: 500.0000 mg | ORAL_CAPSULE | Freq: Four times a day (QID) | ORAL | 0 refills | Status: AC

## 2018-05-29 MED ORDER — SULFAMETHOXAZOLE-TRIMETHOPRIM 800-160 MG PO TABS: 1.0000 | ORAL_TABLET | Freq: Two times a day (BID) | ORAL | 0 refills | Status: AC

## 2018-05-29 MED ORDER — BUPIVACAINE HCL (PF) 0.5 % IJ SOLN
10.0000 mL | Freq: Once | INTRAMUSCULAR | Status: AC
  Administered 2018-05-29: 10 mL
  Filled 2018-05-29: qty 30

## 2018-05-29 NOTE — Discharge Instructions (Addendum)
Wound Care - After I&D of Abscess  You may remove the bandage after 24 hours.  The only reason to replace the bandage is to protect clothing from drainage. Bandages, if used, should be replaced daily or whenever soiled. The wound may continue to drain for the next 2-3 days.   Clean the wound and surrounding area gently with tap water and mild soap. Rinse well and blot dry. You may shower normally. Soaking the wound in Epsom salt baths for no more than 15 minutes once a day may help rinse out any remaining pus and help with wound healing.  Clean the wound daily to prevent further infection. Do not use cleaners such as hydrogen peroxide or alcohol.   Please take all of your antibiotics until finished!   You may develop abdominal discomfort or diarrhea from the antibiotic.  You may help offset this with probiotics which you can buy or get in yogurt. Do not eat or take the probiotics until 2 hours after your antibiotic.   Scar reduction: Application of a topical antibiotic ointment, such as Neosporin, after the wound has begun to close and heal well can decrease scab formation and reduce scarring. After the wound has healed, application of ointments such as Aquaphor can also reduce scar formation.  The key to scar reduction is keeping the skin well hydrated and supple. Drinking plenty of water throughout the day (At least eight 8oz glasses of water a day) is essential to staying well hydrated.  Sun exposure: Keep the wound out of the sun. After the wound has healed, continue to protect it from the sun by wearing protective clothing or applying sunscreen.  Pain: You may use Tylenol, naproxen, or ibuprofen for pain.  Follow up: Please return to the ED or go to your primary care provider in 2 days for a wound check to assure proper healing.  Return to the ED sooner should signs of worsening infection arise, such as spreading redness, worsening puffiness/swelling, severe increase in pain, fever over  100.5F, or any other major issues.

## 2018-05-29 NOTE — ED Provider Notes (Signed)
[facility] DEPT Provider Note   CSN: [id number] Arrival date & time: 05/28/18  2149     History   Chief Complaint Chief Complaint  Patient presents with  . vaginal boil    HPI Katelyn Shaw is a 32 y.o. female.  HPI   Katelyn Shaw is a 32 y.o. female, with a history of anemia, presenting to the ED with a boil to the genital region noted about 2 days ago.  Pain is sharp/throbbing, moderate to severe, nonradiating.  She has tried warm compresses without drainage.  Denies fever, abdominal pain, urinary symptoms, vaginal pain, or any other complaints.     Past Medical History:  Diagnosis Date  . Anemia   . Bacterial vaginosis    recurring  . Chlamydia   . Herpes    Type 1, lesion X 1  . Trichomonas     Patient Active Problem List   Diagnosis Date Noted  . Post-dates pregnancy 12/23/2016    Past Surgical History:  Procedure Laterality Date  . CESAREAN SECTION    . cyst removed from R ear    . HERNIA REPAIR       OB History    Gravida  2   Para  2   Term  2   Preterm  0   AB  0   Living  2     SAB  0   TAB  0   Ectopic  0   Multiple  0   Live Births  2            Home Medications    Prior to Admission medications   Medication Sig Start Date End Date Taking? Authorizing Provider  cephALEXin (KEFLEX) 500 MG capsule Take 1 capsule (500 mg total) by mouth 4 (four) times daily. 05/29/18   Joy, Shawn C, PA-C  Prenat w/o A Vit-FeFum-FePo-FA (CONCEPT OB) 130-92.4-1 MG CAPS Take 1 tablet by mouth daily. Patient not taking: Reported on 09/19/2017 04/17/16   Tamala Julian, [place], CNM  sulfamethoxazole-trimethoprim (BACTRIM DS,SEPTRA DS) 800-160 MG tablet Take 1 tablet by mouth 2 (two) times daily for 7 days. 05/29/18 06/05/18  Lorayne Bender, PA-C    Family History Family History  Problem Relation Age of Onset  . Hypertension Mother   . Asthma Father   . Anesthesia problems Neg Hx     Social History Social History     Tobacco Use  . Smoking status: Current Every Day Smoker    Packs/day: 0.00    Years: 6.00    Pack years: 0.00    Types: Cigarettes    Last attempt to quit: 11/18/2016    Years since quitting: 1.5  . Smokeless tobacco: Never Used  . Tobacco comment: 2-3 cigarettes/day  Substance Use Topics  . Alcohol use: Yes    Comment: Occas. once per month  . Drug use: No     Allergies   Codeine   Review of Systems Review of Systems  Constitutional: Negative for fever.  Gastrointestinal: Negative for nausea and vomiting.  Genitourinary: Negative for dysuria.  Skin:       Possible abscess to the genital region.     Physical Exam Updated Vital Signs BP 129/81 (BP Location: Left Arm)   Pulse 74   Temp 97.8 F (36.6 C) (Oral)   Resp 14   LMP 05/19/2018   SpO2 100%   Physical Exam  Constitutional: She appears well-developed and well-nourished. No distress.  HENT:  Head:  Normocephalic and atraumatic.  Eyes: Conjunctivae are normal.  Neck: Neck supple.  Cardiovascular: Normal rate and regular rhythm.  Pulmonary/Chest: Effort normal.  Abdominal: Soft. There is no tenderness.  Neurological: She is alert.  Skin: Skin is warm and dry. She is not diaphoretic. No pallor.     RN, Claiborne Billings, served as chaperone during exam.  Psychiatric: She has a normal mood and affect. Her behavior is normal.  Nursing note and vitals reviewed.    ED Treatments / Results  Labs (all labs ordered are listed, but only abnormal results are displayed) Labs Reviewed - No data to display  EKG None  Radiology No results found.  Procedures [place] bedside Date/Time: 05/29/2018 2:44 AM Performed by: Lorayne Bender, PA-C Authorized by: Lorayne Bender, PA-C  Consent: Verbal consent obtained. Risks and benefits: risks, benefits and alternatives were discussed Consent given by: patient Patient understanding: patient states understanding of the procedure being performed Patient identity confirmed: verbally  with patient and provided demographic data Local anesthesia used: no  Anesthesia: Local anesthesia used: no  Sedation: Patient sedated: no  Patient tolerance: Patient tolerated the procedure well with no immediate complications  .Marland KitchenIncision and Drainage Date/Time: 05/29/2018 4:05 AM Performed by: Lorayne Bender, PA-C Authorized by: Lorayne Bender, PA-C   Consent:    Consent obtained:  Verbal   Consent given by:  Patient   Risks discussed:  Incomplete drainage, pain and infection Location:    Type:  Abscess   Size:  2cm   Location:  Anogenital   Anogenital location:  Vulva Pre-procedure details:    Skin preparation:  Betadine Anesthesia (see MAR for exact dosages):    Anesthesia method:  Topical application and local infiltration   Topical anesthetic:  LET   Local anesthetic:  Bupivacaine 0.5% w/o epi Procedure type:    Complexity:  Complex Procedure details:    Incision types:  Cruciate   Incision depth:  Subcutaneous   Scalpel blade:  11   Wound management:  Probed and deloculated, irrigated with saline and extensive cleaning   Drainage:  Bloody and purulent   Drainage amount:  Moderate   Packing materials:  1/2 in iodoform gauze Post-procedure details:    Patient tolerance of procedure:  Tolerated well, no immediate complications   (including critical care time)  EMERGENCY DEPARTMENT US SOFT TISSUE INTERPRETATION "Study: Limited Soft Tissue Ultrasound"  INDICATIONS: Pain and Soft tissue infection Multiple views of the body part were obtained in real-time with a multi-frequency linear probe  PERFORMED BY: Myself IMAGES ARCHIVED?: Yes SIDE:Right  BODY PART:Right inguinal region INTERPRETATION:  Abcess present and Cellulitis present    Medications Ordered in ED Medications  cephALEXin (KEFLEX) capsule 500 mg (has no administration in time range)  sulfamethoxazole-trimethoprim (BACTRIM DS,SEPTRA DS) 800-160 MG per tablet 1 tablet (has no administration in time  range)  Tdap (BOOSTRIX) injection 0.5 mL (0.5 mLs Intramuscular Given 05/29/18 0325)  bupivacaine (MARCAINE) 0.5 % injection 10 mL (10 mLs Infiltration Given 05/29/18 0321)  lidocaine-EPINEPHrine-tetracaine (LET) solution (3 mLs Topical Given 05/29/18 0321)     Initial Impression / Assessment and Plan / ED Course  I have reviewed the triage vital signs and the nursing notes.  Pertinent labs & imaging results that were available during my care of the patient were reviewed by me and considered in my medical decision making (see chart for details).     Patient presents with a genital abscess.  I&D performed without complication, patient voiced improvement in pain.  Evidence of concurrent cellulitis on ultrasound.  Patient endorses a history of MRSA infection. The patient was given instructions for home care as well as return precautions. Patient voices understanding of these instructions, accepts the plan, and is comfortable with discharge.  Final Clinical Impressions(s) / ED Diagnoses   Final diagnoses:  Abscess    ED Discharge Orders         Ordered    cephALEXin (KEFLEX) 500 MG capsule  4 times daily     05/29/18 0426    sulfamethoxazole-trimethoprim (BACTRIM DS,SEPTRA DS) 800-160 MG tablet  2 times daily     05/29/18 0426           Lorayne Bender, PA-C 05/29/18 0431    Molpus, Jenny Reichmann, MD 05/29/18 [phone]

## 2019-10-22 ENCOUNTER — Ambulatory Visit: Payer: Self-pay | Attending: Internal Medicine

## 2019-10-22 DIAGNOSIS — Z20822 Contact with and (suspected) exposure to covid-19: Secondary | ICD-10-CM

## 2019-10-23 LAB — NOVEL CORONAVIRUS, NAA: SARS-CoV-2, NAA: NOT DETECTED

## 2020-10-06 ENCOUNTER — Ambulatory Visit (INDEPENDENT_AMBULATORY_CARE_PROVIDER_SITE_OTHER): Payer: Self-pay | Admitting: Primary Care

## 2021-05-13 ENCOUNTER — Telehealth: Payer: Self-pay | Admitting: Hematology

## 2021-05-13 NOTE — Telephone Encounter (Signed)
Scheduled appt per 9/1 referral. Pt is aware of appt date and time.

## 2021-05-23 ENCOUNTER — Inpatient Hospital Stay: Payer: 59 | Attending: Hematology | Admitting: Hematology

## 2021-05-23 ENCOUNTER — Inpatient Hospital Stay: Payer: 59

## 2021-05-23 ENCOUNTER — Other Ambulatory Visit: Payer: Self-pay

## 2021-05-23 VITALS — BP 118/69 | HR 78 | Temp 99.6°F | Resp 16 | Ht 64.0 in | Wt 159.5 lb

## 2021-05-23 DIAGNOSIS — D500 Iron deficiency anemia secondary to blood loss (chronic): Secondary | ICD-10-CM

## 2021-05-23 DIAGNOSIS — N920 Excessive and frequent menstruation with regular cycle: Secondary | ICD-10-CM | POA: Diagnosis not present

## 2021-05-23 DIAGNOSIS — D250 Submucous leiomyoma of uterus: Secondary | ICD-10-CM | POA: Insufficient documentation

## 2021-05-23 DIAGNOSIS — F17210 Nicotine dependence, cigarettes, uncomplicated: Secondary | ICD-10-CM | POA: Insufficient documentation

## 2021-05-23 LAB — FERRITIN: Ferritin: 4 ng/mL — ABNORMAL LOW (ref 11–307)

## 2021-05-23 LAB — CBC WITH DIFFERENTIAL/PLATELET
Abs Immature Granulocytes: 0.03 10*3/uL (ref 0.00–0.07)
Basophils Absolute: 0.1 10*3/uL (ref 0.0–0.1)
Basophils Relative: 1 %
Eosinophils Absolute: 0.4 10*3/uL (ref 0.0–0.5)
Eosinophils Relative: 4 %
HCT: 28.6 % — ABNORMAL LOW (ref 36.0–46.0)
Hemoglobin: 8.2 g/dL — ABNORMAL LOW (ref 12.0–15.0)
Immature Granulocytes: 0 %
Lymphocytes Relative: 32 %
Lymphs Abs: 3 10*3/uL (ref 0.7–4.0)
MCH: 21 pg — ABNORMAL LOW (ref 26.0–34.0)
MCHC: 28.7 g/dL — ABNORMAL LOW (ref 30.0–36.0)
MCV: 73.3 fL — ABNORMAL LOW (ref 80.0–100.0)
Monocytes Absolute: 0.7 10*3/uL (ref 0.1–1.0)
Monocytes Relative: 7 %
Neutro Abs: 5.2 10*3/uL (ref 1.7–7.7)
Neutrophils Relative %: 56 %
Platelets: 505 10*3/uL — ABNORMAL HIGH (ref 150–400)
RBC: 3.9 MIL/uL (ref 3.87–5.11)
RDW: 17 % — ABNORMAL HIGH (ref 11.5–15.5)
WBC: 9.3 10*3/uL (ref 4.0–10.5)
nRBC: 0 % (ref 0.0–0.2)

## 2021-05-23 LAB — CMP (CANCER CENTER ONLY)
ALT: 13 U/L (ref 0–44)
AST: 16 U/L (ref 15–41)
Albumin: 4.7 g/dL (ref 3.5–5.0)
Alkaline Phosphatase: 39 U/L (ref 38–126)
Anion gap: 10 (ref 5–15)
BUN: 11 mg/dL (ref 6–20)
CO2: 25 mmol/L (ref 22–32)
Calcium: 9.8 mg/dL (ref 8.9–10.3)
Chloride: 104 mmol/L (ref 98–111)
Creatinine: 0.76 mg/dL (ref 0.44–1.00)
GFR, Estimated: >60 mL/min (ref >60)
Glucose, Bld: 84 mg/dL (ref 70–99)
Potassium: 3.4 mmol/L — ABNORMAL LOW (ref 3.5–5.1)
Sodium: 139 mmol/L (ref 135–145)
Total Bilirubin: 0.6 mg/dL (ref 0.3–1.2)
Total Protein: 8.5 g/dL — ABNORMAL HIGH (ref 6.5–8.1)

## 2021-05-23 LAB — IRON AND TIBC
Iron: 23 ug/dL — ABNORMAL LOW (ref 41–142)
Saturation Ratios: 4 % — ABNORMAL LOW (ref 21–57)
TIBC: 545 ug/dL — ABNORMAL HIGH (ref 236–444)
UIBC: 522 ug/dL — ABNORMAL HIGH (ref 120–384)

## 2021-05-23 LAB — VITAMIN B12: Vitamin B-12: 456 pg/mL (ref 180–914)

## 2021-05-25 ENCOUNTER — Telehealth: Payer: Self-pay | Admitting: Hematology

## 2021-05-25 NOTE — Telephone Encounter (Signed)
Scheduled follow-up appointments per 9/12 los. Patient is aware.

## 2021-05-30 ENCOUNTER — Other Ambulatory Visit: Payer: Self-pay

## 2021-05-30 ENCOUNTER — Encounter: Payer: Self-pay | Admitting: Hematology

## 2021-05-30 ENCOUNTER — Inpatient Hospital Stay: Payer: 59

## 2021-05-30 VITALS — BP 115/67 | HR 80 | Temp 99.4°F | Resp 16

## 2021-05-30 DIAGNOSIS — D500 Iron deficiency anemia secondary to blood loss (chronic): Secondary | ICD-10-CM | POA: Diagnosis not present

## 2021-05-30 MED ORDER — ONDANSETRON HCL 4 MG/2ML IJ SOLN
INTRAMUSCULAR | Status: AC
  Administered 2021-05-30: 4 mg via INTRAVENOUS
  Filled 2021-05-30: qty 2

## 2021-05-30 MED ORDER — LORATADINE 10 MG PO TABS
10.0000 mg | ORAL_TABLET | Freq: Once | ORAL | Status: AC
  Administered 2021-05-30: 10 mg via ORAL
  Filled 2021-05-30: qty 1

## 2021-05-30 MED ORDER — SODIUM CHLORIDE 0.9 % IV SOLN
750.0000 mg | Freq: Once | INTRAVENOUS | Status: AC
  Administered 2021-05-30: 750 mg via INTRAVENOUS
  Filled 2021-05-30: qty 15

## 2021-05-30 MED ORDER — ACETAMINOPHEN 325 MG PO TABS
650.0000 mg | ORAL_TABLET | Freq: Once | ORAL | Status: AC
  Administered 2021-05-30: 650 mg via ORAL
  Filled 2021-05-30: qty 2

## 2021-05-30 MED ORDER — ONDANSETRON HCL 4 MG/2ML IJ SOLN: 4.0000 mg | Freq: Once | INTRAMUSCULAR | Status: AC

## 2021-05-30 MED ORDER — SODIUM CHLORIDE 0.9 % IV SOLN: Freq: Once | INTRAVENOUS | Status: AC

## 2021-05-30 NOTE — Progress Notes (Signed)
[facility]   HEMATOLOGY/ONCOLOGY CONSULTATION NOTE  Date of Service: 05/30/2021  Patient Care Team: Patient, No Pcp Per (Inactive) as PCP - General (General Practice)  CHIEF COMPLAINTS/PURPOSE OF CONSULTATION:  Iron deficiency anemia  HISTORY OF PRESENTING ILLNESS:   Katelyn Shaw is a wonderful 35 y.o. female who has been referred to [place] by Dr Laqueta Linden DO for evaluation and management of iron deficiency anemia to consider IV iron replacement.  Patient was seen by her OB/GYN doctor at [facility] for menorrhagia.  Patient is a G2P2 apparently has been having menorrhagia for several years due to submucosal uterine fibroid.  She has apparently been on oral contraceptive pills 3 years ago and prior to that had an IUD and notes that neither approach slow down her uterine bleeding. She is currently using Lysteda to try to help control her periods.  She recently has noted significant worsening fatigue and ice craving and was referred to [place] for consideration of IV iron infusions. His most notably with history of blood transfusions or IV iron. Patient notes that she has been on oral iron for the last couple of months without any significant improvement.  Last available labs on 03/29/2021 showed a hemoglobin of 9.1 with an MCV of 70.9, WBC count of 9.9k and platelet count of 503k. Iron labs are not available on this patient from her OB/GYN doctor at this time.  Patient denies any other source of bleeding including GI bleeding nosebleeds gum bleeds hematuria etc.   MEDICAL HISTORY:  Past Medical History:  Diagnosis Date   Anemia    Bacterial vaginosis    recurring   Chlamydia    Herpes    Type 1, lesion X 1   Trichomonas     SURGICAL HISTORY: Past Surgical History:  Procedure Laterality Date   CESAREAN SECTION     cyst removed from R ear     HERNIA REPAIR      SOCIAL HISTORY: Social History   Socioeconomic History   Marital status: Single    Spouse name:  Not on file   Number of children: Not on file   Years of education: Not on file   Highest education level: Not on file  Occupational History   Not on file  Tobacco Use   Smoking status: Every Day    Packs/day: 0.00    Years: 6.00    Pack years: 0.00    Types: Cigarettes    Last attempt to quit: 11/18/2016    Years since quitting: 4.5   Smokeless tobacco: Never   Tobacco comments:    2-3 cigarettes/day  Substance and Sexual Activity   Alcohol use: Yes    Comment: Occas. once per month   Drug use: No   Sexual activity: Yes    Birth control/protection: None    Comment: Apr 16, 2014  Other Topics Concern   Not on file  Social History Narrative   Not on file   Social Determinants of Health   Financial Resource Strain: Not on file  Food Insecurity: Not on file  Transportation Needs: Not on file  Physical Activity: Not on file  Stress: Not on file  Social Connections: Not on file  Intimate Partner Violence: Not on file    FAMILY HISTORY: Family History  Problem Relation Age of Onset   Hypertension Mother    Asthma Father    Anesthesia problems Neg Hx     ALLERGIES:  is allergic to codeine.  MEDICATIONS:  Current Outpatient Medications  Medication Sig Dispense Refill   cephALEXin (KEFLEX) 500 MG capsule Take 1 capsule (500 mg total) by mouth 4 (four) times daily. 20 capsule 0   Prenat w/o A Vit-FeFum-FePo-FA (CONCEPT OB) 130-92.4-1 MG CAPS Take 1 tablet by mouth daily. (Patient not taking: Reported on 09/19/2017) 30 capsule 12   No current facility-administered medications for this visit.    REVIEW OF SYSTEMS:    10 Point review of Systems was done is negative except as noted above.  PHYSICAL EXAMINATION: ECOG PERFORMANCE STATUS: 1 - Symptomatic but completely ambulatory  . Vitals:   05/23/21 1305  BP: 118/69  Pulse: 78  Resp: 16  Temp: 99.6 F (37.6 C)  SpO2: 100%   Filed Weights   05/23/21 1305  Weight: 159 lb 8 oz (72.3 kg)   .Body mass index is  27.38 kg/m.  GENERAL:alert, in no acute distress and comfortable SKIN: no acute rashes, no significant lesions EYES: conjunctiva are pink and non-injected, sclera anicteric OROPHARYNX: MMM, no exudates, no oropharyngeal erythema or ulceration NECK: supple, no JVD LYMPH:  no palpable lymphadenopathy in the cervical, axillary or inguinal regions LUNGS: clear to auscultation b/l with normal respiratory effort HEART: regular rate & rhythm ABDOMEN:  normoactive bowel sounds , non tender, not distended. Extremity: no pedal edema PSYCH: alert & oriented x 3 with fluent speech NEURO: no focal motor/sensory deficits  LABORATORY DATA:  I have reviewed the data as listed  . CBC Latest Ref Rng & Units 05/23/2021 12/23/2016 09/01/2016  WBC 4.0 - 10.5 K/uL 9.3 10.5 16.8(H)  Hemoglobin 12.0 - 15.0 g/dL 8.2(L) 10.0(L) 10.5(L)  Hematocrit 36.0 - 46.0 % 28.6(L) 30.3(L) 32.1(L)  Platelets 150 - 400 K/uL 505(H) 294 333    . CMP Latest Ref Rng & Units 05/23/2021 09/01/2016 05/08/2015  Glucose 70 - 99 mg/dL 84 93 111(H)  BUN 6 - 20 mg/dL 11 9 <5(L)  Creatinine 0.44 - 1.00 mg/dL 0.76 0.59 0.84  Sodium 135 - 145 mmol/L 139 136 136  Potassium 3.5 - 5.1 mmol/L 3.4(L) 3.6 3.4(L)  Chloride 98 - 111 mmol/L 104 103 105  CO2 22 - 32 mmol/L '25 25 24  '$ Calcium 8.9 - 10.3 mg/dL 9.8 9.2 9.2  Total Protein 6.5 - 8.1 g/dL 8.5(H) 7.5 7.9  Total Bilirubin 0.3 - 1.2 mg/dL 0.6 0.4 0.6  Alkaline Phos 38 - 126 U/L 39 44 40  AST 15 - 41 U/L '16 20 25  '$ ALT 0 - 44 U/L '13 16 21   '$ . Lab Results  Component Value Date   IRON 23 (L) 05/23/2021   TIBC 545 (H) 05/23/2021   IRONPCTSAT 4 (L) 05/23/2021   (Iron and TIBC)  Lab Results  Component Value Date   FERRITIN 4 (L) 05/23/2021   B12 -- 456   RADIOGRAPHIC STUDIES: I have personally reviewed the radiological images as listed and agreed with the findings in the report. No results found.  ASSESSMENT & PLAN:   35 year old female with  #1 Symptomatic iron  deficiency due to menorrhagia related to submucosal uterine fibroid. #2 pica due to iron deficiency #3 the poor tolerance of oral iron PLAN -Discussed available labs and ordered additional labs to determine current status of the patient's anemia and iron deficiency. -This patient needs to follow-up with OB/GYN to try to control her periods to avoid worsening anemia.  She is trying to take Lysteda during her menstrual periods to try to control bleeding. -For the patient option to continue oral iron  versus consider IV iron replacement. -Patient hemoglobin is down to 8.2 with a ferritin of 4 and iron saturation of 4%.  She is having significant fatigue and would like to proceed with IV iron replacement at this time. -B12 levels are within normal limits at 456. -He was recommended to take over-the-counter B complex 1 capsule p.o. daily to optimize. -Discussed pros and cons of IV iron and potential side effects.  Patient is agreeable to proceed with IV iron replacement.  . Orders Placed This Encounter  Procedures   CBC with Differential/Platelet    Standing Status:   Future    Number of Occurrences:   1    Standing Expiration Date:   05/23/2022   CMP ([place] only)    Standing Status:   Future    Number of Occurrences:   1    Standing Expiration Date:   05/23/2022   Ferritin    Standing Status:   Future    Number of Occurrences:   1    Standing Expiration Date:   05/23/2022   Iron and TIBC    Standing Status:   Future    Number of Occurrences:   1    Standing Expiration Date:   05/23/2022   Vitamin B12    Standing Status:   Future    Number of Occurrences:   1    Standing Expiration Date:   05/23/2022      Follow-up Labs today IV injectafer weekly x 2 doses ASAP RTC with Dr Irene Limbo with labs in 10 weeks All of the patients questions were answered with apparent satisfaction. The patient knows to call the clinic with any problems, questions or concerns.  I spent 30 minutes  counseling the patient face to face. The total time spent in the appointment was 45 minutes and more than 50% was on counseling and direct patient cares.    Sullivan Lone MD [place] AAHIVMS [facility] Hematology/Oncology Physician [facility]  (Office):       [phone] (Work cell):  [phone] (Fax):           [phone]

## 2021-05-30 NOTE — Patient Instructions (Signed)
Ferric Carboxymaltose Injection What is this medication? FERRIC CARBOXYMALTOSE (FER ik kar BOX ee MAWL tose) treats low levels of iron in your body (iron deficiency anemia). Iron is a mineral that plays an important role in making red blood cells, which carry oxygen from your lungs to the rest of your body. This medicine may be used for other purposes; ask your health care provider or pharmacist if you have questions. COMMON BRAND NAME(S): Injectafer What should I tell my care team before I take this medication? They need to know if you have any of these conditions: High blood pressure High levels of iron in the blood An unusual or allergic reaction to iron, other medications, foods, dyes, or preservatives Pregnant or trying to get pregnant Breast-feeding How should I use this medication? This medication is injected into a vein. It is given by your care team in a hospital or clinic setting. Talk to your care team about the use of this medication in children. While it may be given to children as young as 1 year for selected conditions, precautions do apply. Overdosage: If you think you have taken too much of this medicine contact a poison control center or emergency room at once. NOTE: This medicine is only for you. Do not share this medicine with others. What if I miss a dose? Keep appointments for follow-up doses. It is important not to miss your dose. Call your care team if you are unable to keep an appointment. What may interact with this medication? Do not take this medication with any of the following medications: Deferoxamine Dimercaprol Other iron products This list may not describe all possible interactions. Give your health care provider a list of all the medicines, herbs, non-prescription drugs, or dietary supplements you use. Also tell them if you smoke, drink alcohol, or use illegal drugs. Some items may interact with your medicine. What should I watch for while using this  medication? Visit your care team for regular checks on your progress. Tell your care team if your symptoms do not start to get better or if they get worse. You may need blood work while you are taking this medication. You may need to eat more foods that contain iron. Talk to your care team. Foods that contain iron include whole grains/cereals, dried fruits, beans, or peas, leafy green vegetables, and organ meats (liver, kidney). What side effects may I notice from receiving this medication? Side effects that you should report to your care team as soon as possible: Allergic reactions-skin rash, itching, hives, swelling of the face, lips, tongue, or throat Increase in blood pressure Low blood pressure-dizziness, feeling faint or lightheaded, blurry vision Shortness of breath Side effects that usually do not require medical attention (report to your care team if they continue or are bothersome): Flushing Headache Joint pain Muscle pain Nausea Pain, redness, or irritation at injection site This list may not describe all possible side effects. Call your doctor for medical advice about side effects. You may report side effects to FDA at 1-800-FDA-1088. Where should I keep my medication? This medication is given in a hospital or clinic. It will not be stored at home. NOTE: This sheet is a summary. It may not cover all possible information. If you have questions about this medicine, talk to your doctor, pharmacist, or health care provider.  2022 [employer] (2021-01-14 15:38:05)  

## 2021-06-06 ENCOUNTER — Inpatient Hospital Stay: Payer: 59

## 2021-06-06 ENCOUNTER — Other Ambulatory Visit: Payer: Self-pay

## 2021-06-06 VITALS — BP 111/81 | HR 77 | Temp 98.2°F | Resp 16

## 2021-06-06 DIAGNOSIS — D500 Iron deficiency anemia secondary to blood loss (chronic): Secondary | ICD-10-CM

## 2021-06-06 MED ORDER — SODIUM CHLORIDE 0.9 % IV SOLN
750.0000 mg | Freq: Once | INTRAVENOUS | Status: AC
  Administered 2021-06-06: 750 mg via INTRAVENOUS
  Filled 2021-06-06: qty 15

## 2021-06-06 MED ORDER — SODIUM CHLORIDE 0.9 % IV SOLN: Freq: Once | INTRAVENOUS | Status: AC

## 2021-06-06 MED ORDER — ACETAMINOPHEN 325 MG PO TABS
650.0000 mg | ORAL_TABLET | Freq: Once | ORAL | Status: AC
  Administered 2021-06-06: 650 mg via ORAL
  Filled 2021-06-06: qty 2

## 2021-06-06 MED ORDER — LORATADINE 10 MG PO TABS
10.0000 mg | ORAL_TABLET | Freq: Once | ORAL | Status: AC
  Administered 2021-06-06: 10 mg via ORAL
  Filled 2021-06-06: qty 1

## 2021-06-06 NOTE — Progress Notes (Signed)
Pt declined to stay for 30 minutes post observation.

## 2021-06-06 NOTE — Patient Instructions (Signed)
Ferric Carboxymaltose Injection What is this medication? FERRIC CARBOXYMALTOSE (FER ik kar BOX ee MAWL tose) treats low levels of iron in your body (iron deficiency anemia). Iron is a mineral that plays an important role in making red blood cells, which carry oxygen from your lungs to the rest of your body. This medicine may be used for other purposes; ask your health care provider or pharmacist if you have questions. COMMON BRAND NAME(S): Injectafer What should I tell my care team before I take this medication? They need to know if you have any of these conditions: High blood pressure High levels of iron in the blood An unusual or allergic reaction to iron, other medications, foods, dyes, or preservatives Pregnant or trying to get pregnant Breast-feeding How should I use this medication? This medication is injected into a vein. It is given by your care team in a hospital or clinic setting. Talk to your care team about the use of this medication in children. While it may be given to children as young as 1 year for selected conditions, precautions do apply. Overdosage: If you think you have taken too much of this medicine contact a poison control center or emergency room at once. NOTE: This medicine is only for you. Do not share this medicine with others. What if I miss a dose? Keep appointments for follow-up doses. It is important not to miss your dose. Call your care team if you are unable to keep an appointment. What may interact with this medication? Do not take this medication with any of the following medications: Deferoxamine Dimercaprol Other iron products This list may not describe all possible interactions. Give your health care provider a list of all the medicines, herbs, non-prescription drugs, or dietary supplements you use. Also tell them if you smoke, drink alcohol, or use illegal drugs. Some items may interact with your medicine. What should I watch for while using this  medication? Visit your care team for regular checks on your progress. Tell your care team if your symptoms do not start to get better or if they get worse. You may need blood work while you are taking this medication. You may need to eat more foods that contain iron. Talk to your care team. Foods that contain iron include whole grains/cereals, dried fruits, beans, or peas, leafy green vegetables, and organ meats (liver, kidney). What side effects may I notice from receiving this medication? Side effects that you should report to your care team as soon as possible: Allergic reactions-skin rash, itching, hives, swelling of the face, lips, tongue, or throat Increase in blood pressure Low blood pressure-dizziness, feeling faint or lightheaded, blurry vision Shortness of breath Side effects that usually do not require medical attention (report to your care team if they continue or are bothersome): Flushing Headache Joint pain Muscle pain Nausea Pain, redness, or irritation at injection site This list may not describe all possible side effects. Call your doctor for medical advice about side effects. You may report side effects to FDA at 1-800-FDA-1088. Where should I keep my medication? This medication is given in a hospital or clinic. It will not be stored at home. NOTE: This sheet is a summary. It may not cover all possible information. If you have questions about this medicine, talk to your doctor, pharmacist, or health care provider.  2022 [employer] (2021-01-14 15:38:05)  

## 2021-07-29 ENCOUNTER — Other Ambulatory Visit: Payer: Self-pay

## 2021-07-29 DIAGNOSIS — D500 Iron deficiency anemia secondary to blood loss (chronic): Secondary | ICD-10-CM

## 2021-08-01 ENCOUNTER — Inpatient Hospital Stay: Payer: 59 | Attending: Hematology | Admitting: Hematology

## 2021-08-01 ENCOUNTER — Inpatient Hospital Stay: Payer: 59

## 2022-06-07 ENCOUNTER — Encounter: Payer: Self-pay | Admitting: Hematology

## 2024-07-09 ENCOUNTER — Other Ambulatory Visit (HOSPITAL_BASED_OUTPATIENT_CLINIC_OR_DEPARTMENT_OTHER): Payer: Self-pay

## 2024-07-09 ENCOUNTER — Encounter: Payer: Self-pay | Admitting: Hematology

## 2024-07-16 DIAGNOSIS — Z23 Encounter for immunization: Secondary | ICD-10-CM | POA: Diagnosis not present

## 2024-08-13 ENCOUNTER — Encounter: Payer: Self-pay | Admitting: Hematology

## 2024-08-20 DIAGNOSIS — Z1231 Encounter for screening mammogram for malignant neoplasm of breast: Secondary | ICD-10-CM

## 2024-09-02 ENCOUNTER — Ambulatory Visit: Admitting: Medical
